# Patient Record
Sex: Female | Born: 1990
Health system: Southern US, Community
[De-identification: ages and names within clinical notes are randomized; demographics above are authoritative.]

## PROBLEM LIST (undated history)

## (undated) DIAGNOSIS — G43909 Migraine, unspecified, not intractable, without status migrainosus: Secondary | ICD-10-CM

## (undated) DIAGNOSIS — J42 Unspecified chronic bronchitis: Secondary | ICD-10-CM

## (undated) DIAGNOSIS — N200 Calculus of kidney: Secondary | ICD-10-CM

## (undated) HISTORY — PX: OTHER SURGICAL HISTORY: SHX169

## (undated) HISTORY — DX: Migraine, unspecified, not intractable, without status migrainosus: G43.909

## (undated) HISTORY — PX: ECTOPIC PREGNANCY SURGERY: SHX613

---

## 2018-07-11 ENCOUNTER — Other Ambulatory Visit: Payer: Self-pay

## 2018-07-11 ENCOUNTER — Emergency Department (HOSPITAL_COMMUNITY)
Admission: EM | Admit: 2018-07-11 | Discharge: 2018-07-11 | Disposition: A | Discharge status: Home / Self Care | Payer: 59 | Attending: Emergency Medicine | Admitting: Emergency Medicine

## 2018-07-11 ENCOUNTER — Emergency Department (HOSPITAL_COMMUNITY): Payer: 59

## 2018-07-11 ENCOUNTER — Encounter (HOSPITAL_COMMUNITY): Payer: Self-pay

## 2018-07-11 DIAGNOSIS — M549 Dorsalgia, unspecified: Secondary | ICD-10-CM | POA: Diagnosis present

## 2018-07-11 DIAGNOSIS — Z79899 Other long term (current) drug therapy: Secondary | ICD-10-CM | POA: Insufficient documentation

## 2018-07-11 DIAGNOSIS — J189 Pneumonia, unspecified organism: Secondary | ICD-10-CM

## 2018-07-11 DIAGNOSIS — J181 Lobar pneumonia, unspecified organism: Secondary | ICD-10-CM | POA: Insufficient documentation

## 2018-07-11 HISTORY — DX: Unspecified chronic bronchitis: J42

## 2018-07-11 HISTORY — DX: Calculus of kidney: N20.0

## 2018-07-11 LAB — CBC WITH DIFFERENTIAL/PLATELET
Basophils Absolute: 0 10*3/uL (ref 0.0–0.1)
Basophils Relative: 0 %
Eosinophils Absolute: 0 10*3/uL (ref 0.0–0.7)
Eosinophils Relative: 0 %
HCT: 41.1 % (ref 36.0–46.0)
Hemoglobin: 13.9 g/dL (ref 12.0–15.0)
Lymphocytes Relative: 24 %
Lymphs Abs: 1.4 10*3/uL (ref 0.7–4.0)
MCH: 29.3 pg (ref 26.0–34.0)
MCHC: 33.8 g/dL (ref 30.0–36.0)
MCV: 86.5 fL (ref 78.0–100.0)
Monocytes Absolute: 0.5 10*3/uL (ref 0.1–1.0)
Monocytes Relative: 8 %
Neutro Abs: 3.9 10*3/uL (ref 1.7–7.7)
Neutrophils Relative %: 68 %
Platelets: 194 10*3/uL (ref 150–400)
RBC: 4.75 MIL/uL (ref 3.87–5.11)
RDW: 13 % (ref 11.5–15.5)
WBC: 5.7 10*3/uL (ref 4.0–10.5)

## 2018-07-11 LAB — URINALYSIS, ROUTINE W REFLEX MICROSCOPIC
Bacteria, UA: NONE SEEN
Bilirubin Urine: NEGATIVE
Glucose, UA: NEGATIVE mg/dL
Ketones, ur: NEGATIVE mg/dL
Leukocytes, UA: NEGATIVE
Nitrite: NEGATIVE
Protein, ur: NEGATIVE mg/dL
Specific Gravity, Urine: 1.026 (ref 1.005–1.030)
pH: 6 (ref 5.0–8.0)

## 2018-07-11 LAB — COMPREHENSIVE METABOLIC PANEL
ALT: 22 U/L (ref 0–44)
AST: 18 U/L (ref 15–41)
Albumin: 3.8 g/dL (ref 3.5–5.0)
Alkaline Phosphatase: 66 U/L (ref 38–126)
Anion gap: 8 (ref 5–15)
BUN: 10 mg/dL (ref 6–20)
CO2: 22 mmol/L (ref 22–32)
Calcium: 8.6 mg/dL — ABNORMAL LOW (ref 8.9–10.3)
Chloride: 108 mmol/L (ref 98–111)
Creatinine, Ser: 0.59 mg/dL (ref 0.44–1.00)
GFR calc Af Amer: >60 mL/min (ref >60)
GFR calc non Af Amer: >60 mL/min (ref >60)
Glucose, Bld: 92 mg/dL (ref 70–99)
Potassium: 3.6 mmol/L (ref 3.5–5.1)
Sodium: 138 mmol/L (ref 135–145)
Total Bilirubin: 1.1 mg/dL (ref 0.3–1.2)
Total Protein: 7.1 g/dL (ref 6.5–8.1)

## 2018-07-11 LAB — PREGNANCY, URINE: Preg Test, Ur: NEGATIVE

## 2018-07-11 LAB — GROUP A STREP BY PCR: Group A Strep by PCR: NOT DETECTED

## 2018-07-11 MED ORDER — ALBUTEROL SULFATE HFA 108 (90 BASE) MCG/ACT IN AERS: 1.0000 | INHALATION_SPRAY | Freq: Four times a day (QID) | RESPIRATORY_TRACT | 0 refills | Status: DC | PRN

## 2018-07-11 MED ORDER — BENZONATATE 100 MG PO CAPS: 100.0000 mg | ORAL_CAPSULE | Freq: Three times a day (TID) | ORAL | 0 refills | Status: DC

## 2018-07-11 MED ORDER — DOXYCYCLINE HYCLATE 100 MG PO CAPS: 100.0000 mg | ORAL_CAPSULE | Freq: Two times a day (BID) | ORAL | 0 refills | Status: DC

## 2018-07-11 NOTE — ED Notes (Signed)
Patient verbalized understanding of discharge instructions, no questions. Patient ambulated out of ED with steady gait in no distress.  

## 2018-07-11 NOTE — ED Provider Notes (Signed)
[facility]-EMERGENCY DEPT Provider Note   CSN: [id number] Arrival date & time: 07/11/18  1231     History   Chief Complaint Chief Complaint  Patient presents with  . Back Pain  . Pelvic Pain    HPI Angelica Coleman is a 27 y.o. female with history of kidney stones, chronic bronchitis who presents with a 2-day history of fever, chills, back pain, nausea, headache, and shortness of breath.  Patient also reports some congestion in her chest and sore throat.  She reports left-sided back pain.  She reports last week she felt like she was getting UTI and drink a lot of water and has been feeling better.  She denies abdominal pain, diarrhea, dysuria at this time.  She felt nauseous once yesterday, but did not vomit.  She is tolerating oral fluids.  She has been taking ibuprofen at home.  She reports she has been around some people with a probable virus.  She also reports she moved a week and a half ago and was doing some heavy lifting.  She denies any abnormal vaginal bleeding or discharge.  She reports she did start her menstrual cycle today.  Patient denies any recent long trips, surgeries, known cancer, new leg pain or swelling, history of blood clots, exogenous estrogen use.  HPI  Past Medical History:  Diagnosis Date  . Chronic bronchitis (HCC)    as a child  . Kidney stones     There are no active problems to display for this patient.    OB History   None      Home Medications    Prior to Admission medications   Medication Sig Start Date End Date Taking? Authorizing Provider  ibuprofen (ADVIL,MOTRIN) 200 MG tablet Take 200-400 mg by mouth every 8 (eight) hours as needed for moderate pain.   Yes [provider]  albuterol (PROVENTIL HFA;VENTOLIN HFA) 108 (90 Base) MCG/ACT inhaler Inhale 1-2 puffs into the lungs every 6 (six) hours as needed for wheezing or shortness of breath. 07/11/18   Juliani Laduke, Waylan Boga, PA-C  benzonatate (TESSALON) 100 MG capsule  Take 1 capsule (100 mg total) by mouth every 8 (eight) hours. 07/11/18   Mayreli Alden, Waylan Boga, PA-C  doxycycline (VIBRAMYCIN) 100 MG capsule Take 1 capsule (100 mg total) by mouth 2 (two) times daily. 07/11/18   Emi Holes, PA-C    Family History No family history on file.  Social History Social History   Tobacco Use  . Smoking status: Not on file  Substance Use Topics  . Alcohol use: Not on file  . Drug use: Not on file     Allergies   Percocet [oxycodone-acetaminophen] and Penicillins   Review of Systems Review of Systems  Constitutional: Positive for chills, fatigue and fever.  HENT: Positive for sore throat. Negative for facial swelling.   Respiratory: Positive for cough and shortness of breath.   Cardiovascular: Negative for chest pain.  Gastrointestinal: Positive for nausea. Negative for abdominal pain and vomiting.  Genitourinary: Negative for dysuria.  Musculoskeletal: Positive for back pain.  Skin: Negative for rash and wound.  Neurological: Positive for weakness and headaches.  Psychiatric/Behavioral: The patient is not nervous/anxious.      Physical Exam Updated Vital Signs BP 113/84   Pulse 92   Temp 99.1 F (37.3 C) (Oral)   Resp 17   Ht 5\' 2"  (1.575 m)   Wt 86.2 kg (190 lb)   LMP 07/10/2018   SpO2 99%   BMI  34.75 kg/m   Physical Exam  Constitutional: She appears well-developed and well-nourished. No distress.  HENT:  Head: Normocephalic and atraumatic.  Right Ear: Tympanic membrane normal.  Left Ear: Tympanic membrane normal.  Mouth/Throat: Mucous membranes are normal. Posterior oropharyngeal edema present. No oropharyngeal exudate, posterior oropharyngeal erythema or tonsillar abscesses. Tonsils are 2+ on the right. Tonsils are 2+ on the left. No tonsillar exudate.  Eyes: Pupils are equal, round, and reactive to light. Conjunctivae are normal. Right eye exhibits no discharge. Left eye exhibits no discharge. No scleral icterus.  Neck: Normal  range of motion. Neck supple. No thyromegaly present.  Cardiovascular: Normal rate, regular rhythm, normal heart sounds and intact distal pulses. Exam reveals no gallop and no friction rub.  No murmur heard. Pulmonary/Chest: Effort normal and breath sounds normal. No stridor. No respiratory distress. She has no wheezes. She has no rales.  Abdominal: Soft. Bowel sounds are normal. She exhibits no distension. There is no tenderness. There is CVA tenderness (L). There is no rebound and no guarding.  Musculoskeletal: She exhibits no edema.       Back:  Lymphadenopathy:    She has no cervical adenopathy.  Neurological: She is alert. Coordination normal.  Skin: Skin is warm and dry. No rash noted. She is not diaphoretic. No pallor.  Psychiatric: She has a normal mood and affect.  Nursing note and vitals reviewed.    ED Treatments / Results  Labs (all labs ordered are listed, but only abnormal results are displayed) Labs Reviewed  URINALYSIS, ROUTINE W REFLEX MICROSCOPIC - Abnormal; Notable for the following components:      Result Value   Hgb urine dipstick MODERATE (*)    All other components within normal limits  COMPREHENSIVE METABOLIC PANEL - Abnormal; Notable for the following components:   Calcium 8.6 (*)    All other components within normal limits  GROUP A STREP BY PCR  URINE CULTURE  PREGNANCY, URINE  CBC WITH DIFFERENTIAL/PLATELET    EKG EKG Interpretation  Date/Time:  Wednesday July 11 2018 14:00:17 EDT Ventricular Rate:  88 PR Interval:    QRS Duration: 97 QT Interval:  338 QTC Calculation: 409 R Axis:   16 Text Interpretation:  Sinus rhythm RSR' in V1 or V2, right VCD or RVH Confirmed by Lorre Nick ([id number]) on 07/11/2018 3:51:56 PM   Radiology Dg Chest 2 View  Result Date: 07/11/2018 CLINICAL DATA:  Cough and fever for 4 days.  Shortness breath. EXAM: CHEST - 2 VIEW COMPARISON:  None. FINDINGS: The heart size and mediastinal contours are within normal  limits. Streaky opacity is seen in the posterior left lower lobe, suspicious for pneumonia. Right lung is clear. No evidence of pleural effusion IMPRESSION: Mild posterior left lower lobe infiltrate, suspicious for pneumonia. Electronically Signed   By: Myles Rosenthal M.D.   On: 07/11/2018 13:55    Procedures Procedures (including critical care time)  Medications Ordered in ED Medications - No data to display   Initial Impression / Assessment and Plan / ED Course  I have reviewed the triage vital signs and the nursing notes.  Pertinent labs & imaging results that were available during my care of the patient were reviewed by me and considered in my medical decision making (see chart for details).     Patient presenting with fever, chills, cough,  left-sided back pain.  Chest x-ray is concerning for left lower lobe pneumonia.  Labs unremarkable.  UA is negative for infection, patient on her menstrual cycle  which explains hematuria.  Very low suspicion for kidney stone.  Will treat with doxycycline.  Patient will also be discharged home with albuterol, Tessalon. Patient advised to follow-up and establish care with a primary care provider for recheck and x-ray.  Patient given resources to find a PCP, as she just moved here.  Return precautions discussed.  Patient understands and agrees with plan.  Patient vitals stable throughout ED course and discharged in satisfactory condition.  Final Clinical Impressions(s) / ED Diagnoses   Final diagnoses:  Community acquired pneumonia of left lower lobe of lung [facility](HCC)    ED Discharge Orders        Ordered    doxycycline (VIBRAMYCIN) 100 MG capsule  2 times daily     07/11/18 1600    benzonatate (TESSALON) 100 MG capsule  Every 8 hours     07/11/18 1600    albuterol (PROVENTIL HFA;VENTOLIN HFA) 108 (90 Base) MCG/ACT inhaler  Every 6 hours PRN     07/11/18 1601       Daysy Santini, Waylan Bogalexandra M, PA-C 07/11/18 1626    Lorre NickAllen, Anthony, MD 07/12/18 1258

## 2018-07-11 NOTE — ED Triage Notes (Signed)
Pt c/o chills and sweats x 2 days. Pt reports that she has severe back pain and legs,  and pelvis pain. ( reports moving recently) Pt also  reports that " my bronchial tubes are cracking".  Pt has multiple complaints HA, Nausea yesterday. Pt denies diarrhea, and abdominal pain. Denies pain or burning with urination

## 2018-07-11 NOTE — ED Notes (Signed)
Attempted blood drawn x 2 unsuccessful

## 2018-07-11 NOTE — ED Notes (Signed)
Bed: WA14 Expected date:  Expected time:  Means of arrival:  Comments: Hold for triage 7 

## 2018-07-11 NOTE — Discharge Instructions (Addendum)
Take doxycycline as prescribed until completed.  Take Tessalon every 8 hours as needed for cough.  Use albuterol inhaler every 6 hours as needed for shortness of breath or wheezing.  Alternate ibuprofen and Tylenol, as needed for your fevers.  Please establish care with a primary care provider by calling the number circled on your discharge paperwork.  It is recommended in 4 to 6 weeks that you have a repeat chest x-ray to make sure this is cleared.  You can tell your doctor about this when you see them.  Please return the emergency department if you develop any new or worsening symptoms.

## 2018-07-12 ENCOUNTER — Encounter (HOSPITAL_COMMUNITY): Payer: Self-pay | Admitting: Emergency Medicine

## 2018-07-12 ENCOUNTER — Emergency Department (HOSPITAL_COMMUNITY)
Admission: EM | Admit: 2018-07-12 | Discharge: 2018-07-12 | Disposition: A | Discharge status: Home / Self Care | Payer: 59 | Attending: Emergency Medicine | Admitting: Emergency Medicine

## 2018-07-12 DIAGNOSIS — J189 Pneumonia, unspecified organism: Secondary | ICD-10-CM

## 2018-07-12 DIAGNOSIS — R110 Nausea: Secondary | ICD-10-CM | POA: Diagnosis not present

## 2018-07-12 DIAGNOSIS — J181 Lobar pneumonia, unspecified organism: Secondary | ICD-10-CM | POA: Diagnosis not present

## 2018-07-12 DIAGNOSIS — R509 Fever, unspecified: Secondary | ICD-10-CM | POA: Diagnosis present

## 2018-07-12 LAB — URINALYSIS, ROUTINE W REFLEX MICROSCOPIC
Bilirubin Urine: NEGATIVE
GLUCOSE, UA: NEGATIVE mg/dL
KETONES UR: NEGATIVE mg/dL
Nitrite: NEGATIVE
PROTEIN: NEGATIVE mg/dL
Specific Gravity, Urine: 1.006 (ref 1.005–1.030)
pH: 6 (ref 5.0–8.0)

## 2018-07-12 LAB — URINE CULTURE: Culture: <10000 — AB

## 2018-07-12 LAB — PREGNANCY, URINE: PREG TEST UR: NEGATIVE

## 2018-07-12 MED ORDER — SODIUM CHLORIDE 0.9 % IV BOLUS
1000.0000 mL | Freq: Once | INTRAVENOUS | Status: AC
  Administered 2018-07-12: 1000 mL via INTRAVENOUS

## 2018-07-12 MED ORDER — FAMOTIDINE IN NACL 20-0.9 MG/50ML-% IV SOLN
20.0000 mg | Freq: Once | INTRAVENOUS | Status: AC
  Administered 2018-07-12: 20 mg via INTRAVENOUS
  Filled 2018-07-12: qty 50

## 2018-07-12 MED ORDER — ONDANSETRON 8 MG PO TBDP: 8.0000 mg | ORAL_TABLET | Freq: Three times a day (TID) | ORAL | 0 refills | Status: DC | PRN

## 2018-07-12 MED ORDER — ONDANSETRON HCL 4 MG/2ML IJ SOLN
4.0000 mg | Freq: Once | INTRAMUSCULAR | Status: AC
Start: 2018-07-12 — End: 2018-07-12
  Administered 2018-07-12: 4 mg via INTRAVENOUS
  Filled 2018-07-12: qty 2

## 2018-07-12 MED ORDER — ONDANSETRON HCL 4 MG/2ML IJ SOLN
4.0000 mg | Freq: Once | INTRAMUSCULAR | Status: AC
  Administered 2018-07-12: 4 mg via INTRAVENOUS
  Filled 2018-07-12: qty 2

## 2018-07-12 MED ORDER — ACETAMINOPHEN 500 MG PO TABS
1000.0000 mg | ORAL_TABLET | Freq: Once | ORAL | Status: AC
  Administered 2018-07-12: 1000 mg via ORAL
  Filled 2018-07-12: qty 2

## 2018-07-12 NOTE — ED Triage Notes (Signed)
Pt came to ED 7/17 was told she had pneumonia. Woke up with a fever. Felt so hot she was going to pass out along with nausea. Took motrin and tylenol alternating since 7/17.

## 2018-07-12 NOTE — ED Notes (Signed)
MADE FIRST REQUEST  FOR A URINE SAMPLE PATIENT UNABLE TO PROVIDE ONE AT THIS TIME.

## 2018-07-12 NOTE — ED Provider Notes (Signed)
[facility]-EMERGENCY DEPT Provider Note   CSN: [id number] Arrival date & time: 07/12/18  0758     History   Chief Complaint Chief Complaint  Patient presents with  . Nausea  . Fever    HPI Angelica Coleman is a 27 y.o. female.  Patient seen yesterday and diagnosed with left lower lobe pneumonia.  She was started on oral antibiotics.  She returns today with persistent nausea, intermittent fever, chills, sweats.  No productive sputum.  She is normally healthy.  She does not appear dehydrated.  Severity of symptoms is moderate.  She has taken Tylenol and ibuprofen for fever.     Past Medical History:  Diagnosis Date  . Chronic bronchitis (HCC)    as a child  . Kidney stones     There are no active problems to display for this patient.   History reviewed. No pertinent surgical history.   OB History   None      Home Medications    Prior to Admission medications   Medication Sig Start Date End Date Taking? Authorizing Provider  acetaminophen (TYLENOL) 500 MG tablet Take 1,000 mg by mouth every 6 (six) hours as needed for mild pain.   Yes [provider]  albuterol (PROVENTIL HFA;VENTOLIN HFA) 108 (90 Base) MCG/ACT inhaler Inhale 1-2 puffs into the lungs every 6 (six) hours as needed for wheezing or shortness of breath. 07/11/18  Yes Law, Waylan Boga, PA-C  benzonatate (TESSALON) 100 MG capsule Take 1 capsule (100 mg total) by mouth every 8 (eight) hours. 07/11/18  Yes Law, Waylan Boga, PA-C  doxycycline (VIBRAMYCIN) 100 MG capsule Take 1 capsule (100 mg total) by mouth 2 (two) times daily. 07/11/18  Yes Law, Waylan Boga, PA-C  ibuprofen (ADVIL,MOTRIN) 200 MG tablet Take 200-400 mg by mouth every 8 (eight) hours as needed for moderate pain.   Yes [provider]  ondansetron (ZOFRAN ODT) 8 MG disintegrating tablet Take 1 tablet (8 mg total) by mouth every 8 (eight) hours as needed for nausea or vomiting. 07/12/18   Donnetta Hutching, MD     Family History No family history on file.  Social History Social History   Tobacco Use  . Smoking status: Not on file  Substance Use Topics  . Alcohol use: Not on file  . Drug use: Not on file     Allergies   Percocet [oxycodone-acetaminophen] and Penicillins   Review of Systems Review of Systems  All other systems reviewed and are negative.    Physical Exam Updated Vital Signs BP (!) 156/109 (BP Location: Left Arm)   Pulse 84   Temp 98.7 F (37.1 C) (Oral)   Resp 16   Ht 5\' 2"  (1.575 m)   Wt 86.2 kg (190 lb)   LMP 07/10/2018   SpO2 100%   BMI 34.75 kg/m   Physical Exam  Constitutional: She is oriented to person, place, and time. She appears well-developed and well-nourished.  HENT:  Head: Normocephalic and atraumatic.  Eyes: Conjunctivae are normal.  Neck: Neck supple.  Cardiovascular: Normal rate and regular rhythm.  Pulmonary/Chest: Effort normal and breath sounds normal.  Abdominal: Soft. Bowel sounds are normal.  Musculoskeletal: Normal range of motion.  Neurological: She is alert and oriented to person, place, and time.  Skin: Skin is warm and dry.  Psychiatric: She has a normal mood and affect. Her behavior is normal.  Nursing note and vitals reviewed.    ED Treatments / Results  Labs (all labs ordered  are listed, but only abnormal results are displayed) Labs Reviewed  URINALYSIS, ROUTINE W REFLEX MICROSCOPIC - Abnormal; Notable for the following components:      Result Value   Hgb urine dipstick MODERATE (*)    Leukocytes, UA TRACE (*)    Bacteria, UA RARE (*)    All other components within normal limits  PREGNANCY, URINE    EKG None  Radiology Dg Chest 2 View  Result Date: 07/11/2018 CLINICAL DATA:  Cough and fever for 4 days.  Shortness breath. EXAM: CHEST - 2 VIEW COMPARISON:  None. FINDINGS: The heart size and mediastinal contours are within normal limits. Streaky opacity is seen in the posterior left lower lobe, suspicious  for pneumonia. Right lung is clear. No evidence of pleural effusion IMPRESSION: Mild posterior left lower lobe infiltrate, suspicious for pneumonia. Electronically Signed   By: Myles RosenthalJohn  Stahl M.D.   On: 07/11/2018 13:55    Procedures Procedures (including critical care time)  Medications Ordered in ED Medications  sodium chloride 0.9 % bolus 1,000 mL (0 mLs Intravenous Stopped 07/12/18 1156)  ondansetron (ZOFRAN) injection 4 mg (4 mg Intravenous Given 07/12/18 1009)  sodium chloride 0.9 % bolus 1,000 mL (0 mLs Intravenous Stopped 07/12/18 1344)  acetaminophen (TYLENOL) tablet 1,000 mg (1,000 mg Oral Given 07/12/18 0956)  ondansetron (ZOFRAN) injection 4 mg (4 mg Intravenous Given 07/12/18 1300)  famotidine (PEPCID) IVPB 20 mg premix (20 mg Intravenous New Bag/Given 07/12/18 1300)     Initial Impression / Assessment and Plan / ED Course  I have reviewed the triage vital signs and the nursing notes.  Pertinent labs & imaging results that were available during my care of the patient were reviewed by me and considered in my medical decision making (see chart for details).     Patient presents with a known diagnosis of left lower lobe pneumonia.  She complains of fever, sweats, chills, nausea.  She feels much better after IV hydration, IV Zofran, IV Pepcid.  She is stable for discharge.  Discussed diagnosis with the patient.  Discharge medications Zofran 8 mg.  Final Clinical Impressions(s) / ED Diagnoses   Final diagnoses:  Community acquired pneumonia of left lower lobe of lung (HCC)  Nausea    ED Discharge Orders        Ordered    ondansetron (ZOFRAN ODT) 8 MG disintegrating tablet  Every 8 hours PRN     07/12/18 1435       Donnetta Hutchingook, Kaiesha Tonner, MD 07/12/18 1440

## 2018-07-12 NOTE — Discharge Instructions (Addendum)
I reviewed your x-ray from yesterday.  It does appear that you have a left-sided pneumonia.  Increase fluids.  Tylenol and/or ibuprofen for fever.  Continue your antibiotic.  Prescription for Zofran 8 mg (nausea).

## 2018-09-19 NOTE — Progress Notes (Signed)
NEUROLOGY CONSULTATION NOTE  Angelica Coleman MRN: [id number] DOB: 08-22-1991  Referring provider: Lindaann Pascal, MD (Urgent Care referral) Primary care provider: No PCP  Reason for consult:  Visual disturbance  HISTORY OF PRESENT ILLNESS: Angelica Coleman is a 27 year old left-handed female with mitral regurgitation, palpitations and history of recurrent kidney stones who presents for visual disturbance.  3 years ago, while pregnant with her son, she developed numbness and tingling of her hands and nose associated with vision loss in both eyes described as only seeing the outline of whatever she looks at with blurred vision.  Numbness lasts 20 minutes but visual disturbance lasts longer.  It is followed by severe pounding headache on crown lasting into the next day.  It is associated with nausea, photophobia but no unilateral weakness.  It is aggravated by looking at computer screens or fluorescent lights.  It is relieved by resting in dark room.  On Monday, she got home from work and had another episode.  She went to bed and when she woke up, she was fine.  Later the next day, symptoms returned.  She had associated fatigue.  Symptoms lasted about 20 minutes and followed by the headache.  She went to Urgent Care and was referred to neurology.  She took ibuprofen 200mg  which was ineffective.  600mg  helped to at least go to sleep.  She did not tolerate Excedrin.  She takes Toprol XL 25mg  for palpitations.  Palpitations have been stronger lately and usually occur during her menstrual cycle.  She had a strong menstrual cycle that ended on Sunday.  She reports increased emotional stressors due to recently moving here from [place], new job, and kids starting school.  Caffeine:  No Exercise:  Trying to exercise Depression:  No; Anxiety: Some Sleep hygiene:  Good. Family history:  Mother had stroke in her 52s.  She recently had a migraine with stroke-like symptoms but no prior history of  migraines.  PAST MEDICAL HISTORY: Past Medical History:  Diagnosis Date  . Chronic bronchitis (HCC)    as a child  . Kidney stones   Mitral regurgitation  PAST SURGICAL HISTORY: No past surgical history on file.  MEDICATIONS: Current Outpatient Medications on File Prior to Visit  Medication Sig Dispense Refill  . acetaminophen (TYLENOL) 500 MG tablet Take 1,000 mg by mouth every 6 (six) hours as needed for mild pain.    Marland Kitchen albuterol (PROVENTIL HFA;VENTOLIN HFA) 108 (90 Base) MCG/ACT inhaler Inhale 1-2 puffs into the lungs every 6 (six) hours as needed for wheezing or shortness of breath. 1 Inhaler 0  . benzonatate (TESSALON) 100 MG capsule Take 1 capsule (100 mg total) by mouth every 8 (eight) hours. 21 capsule 0  . doxycycline (VIBRAMYCIN) 100 MG capsule Take 1 capsule (100 mg total) by mouth 2 (two) times daily. 14 capsule 0  . ibuprofen (ADVIL,MOTRIN) 200 MG tablet Take 200-400 mg by mouth every 8 (eight) hours as needed for moderate pain.    Marland Kitchen ondansetron (ZOFRAN ODT) 8 MG disintegrating tablet Take 1 tablet (8 mg total) by mouth every 8 (eight) hours as needed for nausea or vomiting. 15 tablet 0   No current facility-administered medications on file prior to visit.     ALLERGIES: Allergies  Allergen Reactions  . Percocet [Oxycodone-Acetaminophen] Other (See Comments)    Pt reports fainting  . Penicillins Hives    Has patient had a PCN reaction causing immediate rash, facial/tongue/throat swelling, SOB or lightheadedness with hypotension: Yes Has patient had  a PCN reaction causing severe rash involving mucus membranes or skin necrosis: No Has patient had a PCN reaction that required hospitalization: No Has patient had a PCN reaction occurring within the last 10 years: Yes If all of the above answers are "NO", then may proceed with Cephalosporin use.     FAMILY HISTORY: Mom:  Stroke at 55  SOCIAL HISTORY: Social History   Socioeconomic History  . Marital status:  Married    Spouse name: Not on file  . Number of children: Not on file  . Years of education: Not on file  . Highest education level: Not on file  Occupational History  . Not on file  Social Needs  . Financial resource strain: Not on file  . Food insecurity:    Worry: Not on file    Inability: Not on file  . Transportation needs:    Medical: Not on file    Non-medical: Not on file  Tobacco Use  . Smoking status: Not on file  Substance and Sexual Activity  . Alcohol use: Not on file  . Drug use: Not on file  . Sexual activity: Not on file  Lifestyle  . Physical activity:    Days per week: Not on file    Minutes per session: Not on file  . Stress: Not on file  Relationships  . Social connections:    Talks on phone: Not on file    Gets together: Not on file    Attends religious service: Not on file    Active member of club or organization: Not on file    Attends meetings of clubs or organizations: Not on file    Relationship status: Not on file  . Intimate partner violence:    Fear of current or ex partner: Not on file    Emotionally abused: Not on file    Physically abused: Not on file    Forced sexual activity: Not on file  Other Topics Concern  . Not on file  Social History Narrative  . Not on file    REVIEW OF SYSTEMS: Constitutional: No fevers, chills, or sweats, no generalized fatigue, change in appetite Eyes: No visual changes, double vision, eye pain Ear, nose and throat: No hearing loss, ear pain, nasal congestion, sore throat Cardiovascular: palpitations Respiratory:  No shortness of breath at rest or with exertion, wheezes GastrointestinaI: No nausea, vomiting, diarrhea, abdominal pain, fecal incontinence Genitourinary:  No dysuria, urinary retention or frequency Musculoskeletal:  No neck pain, back pain Integumentary: No rash, pruritus, skin lesions Neurological: as above Psychiatric: No depression, insomnia, anxiety Endocrine: No palpitations,  fatigue, diaphoresis, mood swings, change in appetite, change in weight, increased thirst Hematologic/Lymphatic:  No purpura, petechiae. Allergic/Immunologic: no itchy/runny eyes, nasal congestion, recent allergic reactions, rashes  PHYSICAL EXAM: Blood pressure 100/78, pulse 87, height 5\' 2"  (1.575 m), weight 177 lb (80.3 kg), SpO2 98 %. General: No acute distress.  Patient appears well-groomed.  Head:  Normocephalic/atraumatic Eyes:  fundi examined but not visualized Neck: supple, no paraspinal tenderness, full range of motion Back: No paraspinal tenderness Heart: regular rate and rhythm Lungs: Clear to auscultation bilaterally. Vascular: No carotid bruits. Neurological Exam: Mental status: alert and oriented to person, place, and time, recent and remote memory intact, fund of knowledge intact, attention and concentration intact, speech fluent and not dysarthric, language intact. Cranial nerves: CN I: not tested CN II: pupils equal, round and reactive to light, visual fields intact CN III, IV, VI:  full range of  motion, no nystagmus, no ptosis CN V: facial sensation intact CN VII: upper and lower face symmetric CN VIII: hearing intact CN IX, X: gag intact, uvula midline CN XI: sternocleidomastoid and trapezius muscles intact CN XII: tongue midline Bulk & Tone: normal, no fasciculations. Motor:  5/5 throughout  Sensation:  temperature and vibration sensation intact. Deep Tendon Reflexes:  2+ throughout, toes downgoing.  Finger to nose testing:  Without dysmetria.  Heel to shin:  Without dysmetria.  Gait:  Normal station and stride.  Able to turn and tandem walk. Romberg negative.  IMPRESSION: Migraine with aura, without status migrainosus, not intractable  PLAN: 1.  For preventative management, nortriptyline 10mg  at bedtime 2.  For abortive therapy, naproxen 550mg .  Given her history of palpitations/irregular heart rate, advised to establish care with cardiologist and to get  clearance for triptans. 3.  Limit use of pain relievers to no more than 2 days out of week to prevent risk of rebound or medication-overuse headache. 4.  Keep headache diary 5.  Exercise, hydration, caffeine cessation, sleep hygiene, monitor for and avoid triggers 6.  Consider:  magnesium citrate 400mg  daily, riboflavin 400mg  daily, and coenzyme Q10 100mg  three times daily 7.  Follow up in 4 months.  45 minutes spent face to face with patient, over 50% spent discussing diagnosis and plan.  Shon Millet, DO

## 2018-09-20 ENCOUNTER — Encounter: Payer: Self-pay | Admitting: Neurology

## 2018-09-20 ENCOUNTER — Ambulatory Visit (INDEPENDENT_AMBULATORY_CARE_PROVIDER_SITE_OTHER): Payer: 59 | Admitting: Neurology

## 2018-09-20 VITALS — BP 100/78 | HR 87 | Ht 62.0 in | Wt 177.0 lb

## 2018-09-20 DIAGNOSIS — G43109 Migraine with aura, not intractable, without status migrainosus: Secondary | ICD-10-CM

## 2018-09-20 MED ORDER — NORTRIPTYLINE HCL 10 MG PO CAPS: 10.0000 mg | ORAL_CAPSULE | Freq: Every day | ORAL | 3 refills | Status: DC

## 2018-09-20 MED ORDER — NAPROXEN SODIUM 550 MG PO TABS: 550.0000 mg | ORAL_TABLET | Freq: Two times a day (BID) | ORAL | 3 refills | Status: DC | PRN

## 2018-09-20 NOTE — Patient Instructions (Addendum)
Migraine Recommendations: 1.  Start nortriptyline 10mg  at bedtime.  Contact me in 4 weeks with update and we can adjust dose if needed. 2.  Take naproxen 550mg  at earliest onset of migraine.   3.  Limit use of pain relievers to no more than 2 days out of the week.  These medications include acetaminophen, ibuprofen, triptans and narcotics.  This will help reduce risk of rebound headaches. 4.  Be aware of common food triggers such as processed sweets, processed foods with nitrites (such as deli meat, hot dogs, sausages), foods with MSG, alcohol (such as wine), chocolate, certain cheeses, certain fruits (dried fruits, bananas, some citrus fruit), vinegar, diet soda. 4.  Avoid caffeine 5.  Routine exercise 6.  Proper sleep hygiene 7.  Stay adequately hydrated with water 8.  Keep a headache diary. 9.  Maintain proper stress management. 10.  Do not skip meals. 11.  Consider supplements:  Magnesium citrate 400mg  to 600mg  daily, riboflavin 400mg , Coenzyme Q 10 100mg  three times daily 12.  Follow up in 4 months.  Please establish care with a cardiologist and see if it is okay for you to take Imitrex or other triptan medications (for migraines)

## 2018-12-08 ENCOUNTER — Other Ambulatory Visit: Payer: Self-pay | Admitting: Neurology

## 2018-12-31 ENCOUNTER — Telehealth: Payer: Self-pay

## 2018-12-31 MED ORDER — SUMATRIPTAN SUCCINATE 100 MG PO TABS: 100.0000 mg | ORAL_TABLET | ORAL | 0 refills | Status: DC

## 2018-12-31 NOTE — Telephone Encounter (Signed)
Reviewed letter.  No structural heart disease noted.  Can come today for toradol injection and see if that helps.  Can also call imitrex, 100 mg, 1 at onset of headache, may repeat in 2 hours.  Max 2 per day.  Max 2- 3 days per week.  #9.  Refills 0

## 2018-12-31 NOTE — Telephone Encounter (Signed)
Pt called c/o severe headache. Has taken Naproxen 550 mg as directed without relief. She has been taking nortriptyline 10 mg and was prescribed propranolol 60 mg by her cardiologist additionally. This is the first severe headache she has had since adding the propranolol.  Pt states she is unable to even lift her head from the pillow. She denies any N/V. Dr. Everlena CooperJaffe deferred using a triptan to cardiology. We received a letter (scanned 10/29/18) indicating no further cardiac testing needed.   What other recommendations for abortative?

## 2018-12-31 NOTE — Addendum Note (Signed)
Addended by: Dorthy Cooler on: 12/31/2018 09:13 AM   Modules accepted: Orders

## 2018-12-31 NOTE — Telephone Encounter (Signed)
Called and advised Pt 

## 2019-01-01 ENCOUNTER — Telehealth: Payer: Self-pay

## 2019-01-01 NOTE — Telephone Encounter (Signed)
Pt called stating her headache has almost completely resolved, though her right hand has a strange "drawing" feeling. I questioned her if it was similar to the numbness and tingling she has experienced in her hands in the past with her headaches. She said it was, though it has lasted longer, with no numbness and tingling now. I advised Pt not all headaches are the same and symptoms can  present differently. Pt agreed that the headache she had yesterday was intense, very painful and she had to take 2 doses of sumatriptan.  The Pt is at work and will call back if symptom does not resolve or worsens

## 2019-01-21 NOTE — Progress Notes (Signed)
NEUROLOGY FOLLOW UP OFFICE NOTE  Angelica Coleman [id number]  HISTORY OF PRESENT ILLNESS: Angelica Coleman is a 28 year old left-handed woman with mitral regurgitation, palpitations and history of recurrent kidney stones who follows up for migraine with aura.  UPDATE: 3 weeks ago on Monday she woke up with excruciating headache.  Sumatriptan effective after 2nd dose.  She developed numbness in the right hand.  The next day, she woke up and her right hand and forearm was still numb.  She went to work and she had trouble using the keyboard on her computer due to difficulty with her fingers in the right hand.  That night, it started hurting.  Then on Wednesday, she was well.  On Thursday, she developed sudden right sided numbness face, arm, torso and leg, which lasted about 24 hours.    She was having trouble with memory such as names and events.  She had an MRI of brain with and without contrast performed on 01/08/19, which demonstrated 3 mm enhancing nodular focus in the superficial left parietal region with small amount of adjacent parenchymal T2/FLAIR hyperintensity.  Differential mentioned includes "questionable small subacute infarct with parenchymal enhancement versus possible mycotic aneurysm with adjacent inflammatory changes versus other."  Imaging not available to personally review.  She was started on ASA 325mg  daily by her PCP for possible stroke.    She was feeling fatigued for the next couple of weeks.  No fevers.  She started feeling better last week.  Every now and then she notes random bursts of scalp neuralgia (burning/needles) for 5 minutes.    PCP ordered prednisone/promethazine/benadryl cocktail for migraine abortive therapy.  Current NSAID:  Naproxen 550mg  Current analgesic:  None Current triptan:  Sumatriptan Current antihypertensive:  Propranolol ER 60mg  (started by cardiologist soon after last visit) Current antidepressant:  Nortriptyline 10mg . Current triptan:  Sumatriptan  100mg   Caffeine:  No Exercise:  Trying to exercise Depression:  No; Anxiety: Some Sleep hygiene:  Good.  HISTORY: In 2016, while pregnant with her son, she developed numbness and tingling of her hands and nose associated with vision loss in both eyes described as only seeing the outline of whatever she looks at with blurred vision.  Numbness lasts 20 minutes but visual disturbance lasts longer.  It is followed by severe pounding headache on crown lasting into the next day.  It is associated with nausea, photophobia but no unilateral weakness.  It is aggravated by looking at computer screens or fluorescent lights.  It is relieved by resting in dark room.  In September 2019, she got home from work and had another episode.  She went to bed and when she woke up, she was fine.  Later the next day, symptoms returned.  She had associated fatigue.  Symptoms lasted about 20 minutes and followed by the headache.  She went to Urgent Care and was referred to neurology.  She took ibuprofen 200mg  which was ineffective.  600mg  helped to at least go to sleep.  She did not tolerate Excedrin.  She previously took Toprol XL 25mg  for palpitations.  Palpitations have been stronger lately and usually occur during her menstrual cycle.   Family history:  Mother had stroke in her 57s.  She recently had a migraine with stroke-like symptoms but no prior history of migraines.  Past antidepressant:  Nortriptyline (nightmares)  PAST MEDICAL HISTORY: Past Medical History:  Diagnosis Date  . Chronic bronchitis (HCC)    as a child  . Kidney stones  MEDICATIONS: Current Outpatient Medications on File Prior to Visit  Medication Sig Dispense Refill  . acetaminophen (TYLENOL) 500 MG tablet Take 1,000 mg by mouth every 6 (six) hours as needed for mild pain.    Marland Kitchen. albuterol (PROVENTIL HFA;VENTOLIN HFA) 108 (90 Base) MCG/ACT inhaler Inhale 1-2 puffs into the lungs every 6 (six) hours as needed for wheezing or shortness  of breath. 1 Inhaler 0  . ibuprofen (ADVIL,MOTRIN) 200 MG tablet Take 200-400 mg by mouth every 8 (eight) hours as needed for moderate pain.    . metoprolol succinate (TOPROL-XL) 25 MG 24 hr tablet Take 25 mg by mouth daily as needed.    . naproxen sodium (ANAPROX DS) 550 MG tablet Take 1 tablet (550 mg total) by mouth 2 (two) times daily as needed. 20 tablet 3  . nortriptyline (PAMELOR) 10 MG capsule TAKE 1 CAPSULE (10 MG TOTAL) BY MOUTH AT BEDTIME. 90 capsule 1  . ondansetron (ZOFRAN ODT) 8 MG disintegrating tablet Take 1 tablet (8 mg total) by mouth every 8 (eight) hours as needed for nausea or vomiting. 15 tablet 0  . SUMAtriptan (IMITREX) 100 MG tablet Take 1 tablet (100 mg total) by mouth as directed for 1 dose. May repeat in 2 hours if headache persists or recurs. Max 2/24 hrs 9 tablet 0   No current facility-administered medications on file prior to visit.     ALLERGIES: Allergies  Allergen Reactions  . Percocet [Oxycodone-Acetaminophen] Other (See Comments)    Pt reports fainting  . Penicillins Hives    Has patient had a PCN reaction causing immediate rash, facial/tongue/throat swelling, SOB or lightheadedness with hypotension: Yes Has patient had a PCN reaction causing severe rash involving mucus membranes or skin necrosis: No Has patient had a PCN reaction that required hospitalization: No Has patient had a PCN reaction occurring within the last 10 years: Yes If all of the above answers are "NO", then may proceed with Cephalosporin use.     FAMILY HISTORY: Family History  Problem Relation Age of Onset  . Hypertension Mother   . High Cholesterol Mother   . CVA Mother [id number]  . Hypertension Father   . High Cholesterol Father   . Diabetes Sister   . Hypertension Sister    SOCIAL HISTORY: Social History   Socioeconomic History  . Marital status: Married    Spouse name: Josh  . Number of children: 2  . Years of education: Not on file  . Highest education level: Some  college, no degree  Occupational History  . Occupation: Event organisermanager    Employer: [employer]  Social Needs  . Financial resource strain: Not on file  . Food insecurity:    Worry: Not on file    Inability: Not on file  . Transportation needs:    Medical: Not on file    Non-medical: Not on file  Tobacco Use  . Smoking status: Never Smoker  . Smokeless tobacco: Never Used  Substance and Sexual Activity  . Alcohol use: Not on file  . Drug use: Not on file  . Sexual activity: Not on file  Lifestyle  . Physical activity:    Days per week: Not on file    Minutes per session: Not on file  . Stress: Not on file  Relationships  . Social connections:    Talks on phone: Not on file    Gets together: Not on file    Attends religious service: Not on file    Active member of  club or organization: Not on file    Attends meetings of clubs or organizations: Not on file    Relationship status: Not on file  . Intimate partner violence:    Fear of current or ex partner: Not on file    Emotionally abused: Not on file    Physically abused: Not on file    Forced sexual activity: Not on file  Other Topics Concern  . Not on file  Social History Narrative   Patient is left-handed. She lives with her husband and 2 children. She avoids caffeine. She was exercising 5 x week until her son's school schedule changes recently.     REVIEW OF SYSTEMS: Constitutional: No fevers, chills, or sweats, no generalized fatigue, change in appetite Eyes: No visual changes, double vision, eye pain Ear, nose and throat: No hearing loss, ear pain, nasal congestion, sore throat Cardiovascular: No chest pain, palpitations Respiratory:  No shortness of breath at rest or with exertion, wheezes GastrointestinaI: No nausea, vomiting, diarrhea, abdominal pain, fecal incontinence Genitourinary:  No dysuria, urinary retention or frequency Musculoskeletal:  No neck pain, back pain Integumentary: No rash, pruritus, skin  lesions Neurological: as above Psychiatric: No depression, insomnia, anxiety Endocrine: No palpitations, fatigue, diaphoresis, mood swings, change in appetite, change in weight, increased thirst Hematologic/Lymphatic:  No purpura, petechiae. Allergic/Immunologic: no itchy/runny eyes, nasal congestion, recent allergic reactions, rashes  PHYSICAL EXAM: Blood pressure 106/78, pulse 92, height 5\' 2"  (1.575 m), weight 200 lb (90.7 kg), SpO2 97 %. General: No acute distress.  Patient appears well-groomed.   Head:  Normocephalic/atraumatic Eyes:  Fundi examined but not visualized Neck: supple, no paraspinal tenderness, full range of motion Heart:  Regular rate and rhythm Lungs:  Clear to auscultation bilaterally Back: No paraspinal tenderness Neurological Exam: alert and oriented to person, place, and time. Attention span and concentration intact, recent and remote memory intact, fund of knowledge intact.  Speech fluent and not dysarthric, language intact.  CN II-XII intact. Bulk and tone normal, muscle strength 5/5 throughout.  Sensation to light touch, temperature and vibration intact.  Deep tendon reflexes 2+ throughout, toes downgoing.  Finger to nose and heel to shin testing intact.  Gait normal, Romberg negative.  IMPRESSION: Brain MRI abnormality.  Unclear etiology.  .Location may correlate with right sided symptoms.  I think she does have migraine with aura as well.  PLAN: 1. We will first check CTA of head and neck to evaluate for intracranial and extracranial vascular abnormality to explain symptoms and MRI findings, such as mycotic aneurysm.  Pending results, will consider LP.  Repeat MRI of brain with and without contrast is warranted in 3 months. 2. I will have her decrease ASA to 81mg  daily for now until we can identify the lesion on MRI  3. Stop nortriptyline.  For migraine preventative management, she will continue propranolol ER 60mg  daily alone. 4.  Due to possibility of stroke,  she will stop sumatriptan.  For abortive therapy, she may use naproxen. 5.  Limit use of pain relievers to no more than 2 days out of week to prevent risk of rebound or medication-overuse headache. 6.  Keep headache diary 7.  Exercise, hydration, caffeine cessation, sleep hygiene, monitor for and avoid triggers 8.  Consider:  magnesium citrate 400mg  daily, riboflavin 400mg  daily, and coenzyme Q10 100mg  three times daily 9. Will follow up after testing completed.  Shon Millet, DO

## 2019-01-22 ENCOUNTER — Ambulatory Visit (INDEPENDENT_AMBULATORY_CARE_PROVIDER_SITE_OTHER): Payer: 59 | Admitting: Neurology

## 2019-01-22 ENCOUNTER — Encounter: Payer: Self-pay | Admitting: Neurology

## 2019-01-22 VITALS — BP 106/78 | HR 92 | Ht 62.0 in | Wt 200.0 lb

## 2019-01-22 DIAGNOSIS — R2990 Unspecified symptoms and signs involving the nervous system: Secondary | ICD-10-CM | POA: Diagnosis not present

## 2019-01-22 DIAGNOSIS — R9089 Other abnormal findings on diagnostic imaging of central nervous system: Secondary | ICD-10-CM

## 2019-01-22 DIAGNOSIS — G43109 Migraine with aura, not intractable, without status migrainosus: Secondary | ICD-10-CM | POA: Diagnosis not present

## 2019-01-22 NOTE — Patient Instructions (Addendum)
1.  We will check CTA of head and neck.  Further recommendations pending results (May need to perform spinal tap) 2.  Stop nortriptyline and sumatriptan.  May use naproxen but sparingly 3.  Decrease aspirin to 81mg  daily for now. 4.  Schedule follow up in 3 to 4 months but further recommendations will be made between that time.  We have sent a referral to [facility] for your CTA of the head and neck and they will call you directly to schedule your appt. They are located at [address] [facility]. If you need to contact them directly please call [phone].

## 2019-01-29 ENCOUNTER — Ambulatory Visit
Admission: RE | Admit: 2019-01-29 | Discharge: 2019-01-29 | Disposition: A | Discharge status: Home / Self Care | Payer: 59 | Source: Ambulatory Visit | Attending: Neurology | Admitting: Neurology

## 2019-01-29 DIAGNOSIS — R9089 Other abnormal findings on diagnostic imaging of central nervous system: Secondary | ICD-10-CM

## 2019-01-29 DIAGNOSIS — R2990 Unspecified symptoms and signs involving the nervous system: Secondary | ICD-10-CM

## 2019-01-29 MED ORDER — IOPAMIDOL (ISOVUE-370) INJECTION 76%
75.0000 mL | Freq: Once | INTRAVENOUS | Status: AC | PRN
  Administered 2019-01-29: 75 mL via INTRAVENOUS

## 2019-01-31 ENCOUNTER — Telehealth: Payer: Self-pay

## 2019-01-31 ENCOUNTER — Telehealth: Payer: Self-pay | Admitting: Neurology

## 2019-01-31 DIAGNOSIS — R2990 Unspecified symptoms and signs involving the nervous system: Secondary | ICD-10-CM

## 2019-01-31 DIAGNOSIS — R9089 Other abnormal findings on diagnostic imaging of central nervous system: Secondary | ICD-10-CM

## 2019-01-31 NOTE — Telephone Encounter (Signed)
Patient calling for CTA scan results. Please call her back at [phone]. Thanks!

## 2019-01-31 NOTE — Telephone Encounter (Signed)
See imaging note 

## 2019-01-31 NOTE — Telephone Encounter (Signed)
Called and LMOVM asking Pt to return my call 

## 2019-01-31 NOTE — Telephone Encounter (Signed)
-----   Message from Drema [place], DO sent at 01/30/2019 12:13 PM EST ----- The CT shows no vascular abnormality.  I would favor repeating the MRI of brain with and without contrast in 3 months to look for any changes.

## 2019-02-01 ENCOUNTER — Telehealth: Payer: Self-pay | Admitting: Neurology

## 2019-02-01 NOTE — Telephone Encounter (Signed)
Spoke with Pt, gave results. See MRI results note.

## 2019-02-01 NOTE — Addendum Note (Signed)
Addended by: Dorthy Cooler on: 02/01/2019 08:13 AM   Modules accepted: Orders

## 2019-02-01 NOTE — Telephone Encounter (Signed)
Spoke with Pt, advised her of results and of MRI recommendation in 3 months.  Pt was questioning discussion of LP as well. I advise her I will confirm with Dr. Everlena Cooper that is not warranted at this time and message her on My Chart. Pt is fine with that.

## 2019-02-01 NOTE — Telephone Encounter (Signed)
I am inclined to first repeat the MRI rather than going straight to an invasive procedure at this time.

## 2019-02-01 NOTE — Telephone Encounter (Signed)
Patient returning your call regarding results. Thanks

## 2019-02-11 ENCOUNTER — Emergency Department (HOSPITAL_COMMUNITY): Payer: 59

## 2019-02-11 ENCOUNTER — Encounter (HOSPITAL_COMMUNITY): Payer: Self-pay

## 2019-02-11 ENCOUNTER — Other Ambulatory Visit: Payer: Self-pay

## 2019-02-11 ENCOUNTER — Telehealth: Payer: Self-pay | Admitting: Neurology

## 2019-02-11 ENCOUNTER — Emergency Department (HOSPITAL_COMMUNITY)
Admission: EM | Admit: 2019-02-11 | Discharge: 2019-02-11 | Disposition: A | Discharge status: Home / Self Care | Payer: 59 | Attending: Emergency Medicine | Admitting: Emergency Medicine

## 2019-02-11 DIAGNOSIS — G43109 Migraine with aura, not intractable, without status migrainosus: Secondary | ICD-10-CM | POA: Insufficient documentation

## 2019-02-11 DIAGNOSIS — R200 Anesthesia of skin: Secondary | ICD-10-CM | POA: Insufficient documentation

## 2019-02-11 DIAGNOSIS — Z880 Allergy status to penicillin: Secondary | ICD-10-CM | POA: Diagnosis not present

## 2019-02-11 DIAGNOSIS — Z79899 Other long term (current) drug therapy: Secondary | ICD-10-CM | POA: Insufficient documentation

## 2019-02-11 DIAGNOSIS — R001 Bradycardia, unspecified: Secondary | ICD-10-CM | POA: Diagnosis not present

## 2019-02-11 DIAGNOSIS — R202 Paresthesia of skin: Secondary | ICD-10-CM | POA: Diagnosis present

## 2019-02-11 LAB — I-STAT BETA HCG BLOOD, ED (MC, WL, AP ONLY): I-stat hCG, quantitative: <5 m[IU]/mL (ref <5)

## 2019-02-11 LAB — DIFFERENTIAL
Abs Immature Granulocytes: 0.01 10*3/uL (ref 0.00–0.07)
BASOS ABS: 0 10*3/uL (ref 0.0–0.1)
Basophils Relative: 0 %
Eosinophils Absolute: 0.1 10*3/uL (ref 0.0–0.5)
Eosinophils Relative: 2 %
Immature Granulocytes: 0 %
Lymphocytes Relative: 35 %
Lymphs Abs: 2.3 10*3/uL (ref 0.7–4.0)
Monocytes Absolute: 0.4 10*3/uL (ref 0.1–1.0)
Monocytes Relative: 6 %
NEUTROS PCT: 57 %
Neutro Abs: 3.7 10*3/uL (ref 1.7–7.7)

## 2019-02-11 LAB — CBC
HCT: 42.3 % (ref 36.0–46.0)
Hemoglobin: 14 g/dL (ref 12.0–15.0)
MCH: 29.4 pg (ref 26.0–34.0)
MCHC: 33.1 g/dL (ref 30.0–36.0)
MCV: 88.9 fL (ref 80.0–100.0)
PLATELETS: 300 10*3/uL (ref 150–400)
RBC: 4.76 MIL/uL (ref 3.87–5.11)
RDW: 12.4 % (ref 11.5–15.5)
WBC: 6.5 10*3/uL (ref 4.0–10.5)
nRBC: 0 % (ref 0.0–0.2)

## 2019-02-11 LAB — COMPREHENSIVE METABOLIC PANEL
ALT: 24 U/L (ref 0–44)
ANION GAP: 7 (ref 5–15)
AST: 19 U/L (ref 15–41)
Albumin: 4.1 g/dL (ref 3.5–5.0)
Alkaline Phosphatase: 49 U/L (ref 38–126)
BUN: 12 mg/dL (ref 6–20)
CO2: 24 mmol/L (ref 22–32)
Calcium: 9.5 mg/dL (ref 8.9–10.3)
Chloride: 107 mmol/L (ref 98–111)
Creatinine, Ser: 0.71 mg/dL (ref 0.44–1.00)
GFR calc Af Amer: >60 mL/min (ref >60)
GFR calc non Af Amer: >60 mL/min (ref >60)
Glucose, Bld: 99 mg/dL (ref 70–99)
POTASSIUM: 4.4 mmol/L (ref 3.5–5.1)
SODIUM: 138 mmol/L (ref 135–145)
Total Bilirubin: 1.1 mg/dL (ref 0.3–1.2)
Total Protein: 6.6 g/dL (ref 6.5–8.1)

## 2019-02-11 LAB — CBG MONITORING, ED: Glucose-Capillary: 83 mg/dL (ref 70–99)

## 2019-02-11 LAB — PROTIME-INR
INR: 1
Prothrombin Time: 13.1 seconds (ref 11.4–15.2)

## 2019-02-11 LAB — APTT: aPTT: 31 seconds (ref 24–36)

## 2019-02-11 MED ORDER — METOCLOPRAMIDE HCL 5 MG/ML IJ SOLN
10.0000 mg | Freq: Once | INTRAMUSCULAR | Status: AC
  Administered 2019-02-11: 10 mg via INTRAVENOUS
  Filled 2019-02-11: qty 2

## 2019-02-11 MED ORDER — SODIUM CHLORIDE 0.9% FLUSH
3.0000 mL | Freq: Once | INTRAVENOUS | Status: AC
  Administered 2019-02-11: 3 mL via INTRAVENOUS

## 2019-02-11 MED ORDER — DIPHENHYDRAMINE HCL 50 MG/ML IJ SOLN
25.0000 mg | Freq: Once | INTRAMUSCULAR | Status: AC
  Administered 2019-02-11: 25 mg via INTRAVENOUS
  Filled 2019-02-11: qty 1

## 2019-02-11 MED ORDER — GADOBUTROL 1 MMOL/ML IV SOLN
10.0000 mL | Freq: Once | INTRAVENOUS | Status: AC | PRN
  Administered 2019-02-11: 10 mL via INTRAVENOUS

## 2019-02-11 NOTE — Telephone Encounter (Signed)
Patient is not feeling good and needs to know if Dr Everlena Cooper still wants her to go to the ED if she is not feeling well. She states that she usually crashes pretty soon after feeling this way  Please call patient

## 2019-02-11 NOTE — ED Provider Notes (Signed)
28 year old female, complains of numbness and paresthesias on the right side of her body and her face.  This is intermittent, she is not currently having any symptoms.  She does not have a headache at this time and feels very good.  Her MRI is totally normal showing no signs of abnormal findings, there was a single cut with a small amount of flair which the neurologist as can be seen on normal brains.  The patient was made aware of all of these findings and the need to follow-up closely with neurology but no signs of multiple sclerosis, tumors, strokes or aneurysms.  Patient stable for discharge Medical screening examination/treatment/procedure(s) were conducted as a shared visit with non-physician practitioner(s) and myself.  I personally evaluated the patient during the encounter.  Clinical Impression:   Final diagnoses:  Complicated migraine         Eber Hong, MD 02/11/19 1842

## 2019-02-11 NOTE — ED Provider Notes (Signed)
Transfer of care occurred at approximately 4 PM from off going provider.  Plan of care at this time is wait on MRI results regarding right arm, right leg tingling numbness.  Patient has had previous MRI studies which were negative; followed by neurology however due to the symptoms and progression today patient was sent to the emergency department for evaluation.  Patient was given a headache cocktail by prior team. Resting comfortably reassessment, no acute distress.  Symptoms improving here in the emergency department.  MRI negative for any acute findings.  Patient discharged in stable edition was stable vital signs.  Patient is to follow-up with neurology.  The above care was discussed with upon by my attending physician.   Dahlia Client, MD 02/12/19 Angelica Coleman    Angelica Hong, MD 02/13/19 [phone]

## 2019-02-11 NOTE — Telephone Encounter (Signed)
Called and spoke with Pt. She states she was feeling faint, right side feeling numb. She went to the ED. She is presently in a room. Her BP is elevated. The ED is checking an EKG. I advised her we will check the ED notes

## 2019-02-11 NOTE — ED Triage Notes (Signed)
Pt reports feeling light headed and some tingling in the right side of her body. Was seen recently by neurologist for r/o stroke.

## 2019-02-11 NOTE — ED Provider Notes (Signed)
[facility] EMERGENCY DEPARTMENT Provider Note   CSN: [id number] Arrival date & time: 02/11/19  3419     History   Chief Complaint Chief Complaint  Patient presents with  . Weakness    HPI Angelica Coleman is a 28 y.o. female.  Patient currently under the care of Dr. Everlena Cooper at [facility] neurology --presents the emergency department today with numbness and paresthesias on the right side of her body and on her face.  Patient initially had symptoms involving R arm and hand while pregnant about 3 years ago.  She states that she did not really have a work-up, she was treated for complex migraines.  Patient states that her symptoms began to recur in the fall.  She continued to receive treatment for migraines as her symptoms are typically related to headaches.  She has been given Imitrex.  Patient followed up with her doctor and 12/2018.  She had an MRI performed which demonstrated "questionable small subacute infarct with parenchymal enhancement versus possible mycotic aneurysm with adjacent inflammatory changes versus other."  Her neurologist follow this up by ordering CT Angie of the head and neck which were negative.  Current plan is to follow-up with repeat MRI in 3 months and consider lumbar puncture for further testing if symptoms persist.  Patient has had a mild headache over the past 2 days.  Today while at work she developed some lightheadedness, a warm sensation in her right hand, decreased sensation in her right hand and anterior right leg along with some paresthesias of the face.  She does not feel weak today.  She has had no facial droop or difficulty walking.  No nausea or vomiting.  Headache symptoms are currently minimal.  She was told to go to the emergency department if she started feeling bad again.  She has had changes in her vision as well.  She states that her vision has mainly been blurry.  She denies any black spots in her vision or vision loss.     Past Medical  History:  Diagnosis Date  . Chronic bronchitis (HCC)    as a child  . Kidney stones     There are no active problems to display for this patient.   History reviewed. No pertinent surgical history.   OB History   No obstetric history on file.      Home Medications    Prior to Admission medications   Medication Sig Start Date End Date Taking? Authorizing Provider  ondansetron (ZOFRAN ODT) 8 MG disintegrating tablet Take 1 tablet (8 mg total) by mouth every 8 (eight) hours as needed for nausea or vomiting. 07/12/18   Donnetta Hutching, MD  propranolol ER (INDERAL LA) 60 MG 24 hr capsule Take 60 mg by mouth daily. 12/25/18   [provider]    Family History Family History  Problem Relation Age of Onset  . Hypertension Mother   . High Cholesterol Mother   . CVA Mother 63  . Hypertension Father   . High Cholesterol Father   . Diabetes Sister   . Hypertension Sister     Social History Social History   Tobacco Use  . Smoking status: Never Smoker  . Smokeless tobacco: Never Used  Substance Use Topics  . Alcohol use: Not on file  . Drug use: Not on file     Allergies   Percocet [oxycodone-acetaminophen] and Penicillins   Review of Systems Review of Systems  Constitutional: Negative for fever.  HENT: Negative for congestion,  dental problem, rhinorrhea and sinus pressure.   Eyes: Positive for visual disturbance. Negative for photophobia, discharge and redness.  Respiratory: Negative for shortness of breath.   Cardiovascular: Negative for chest pain.  Gastrointestinal: Negative for nausea and vomiting.  Musculoskeletal: Negative for gait problem, neck pain and neck stiffness.  Skin: Negative for rash.  Neurological: Positive for numbness (paresthesias) and headaches. Negative for syncope, speech difficulty, weakness and light-headedness.  Psychiatric/Behavioral: Negative for confusion.     Physical Exam Updated Vital Signs BP (!) 127/98 (BP Location: Left  Arm)   Pulse 65   Temp 98.4 F (36.9 C) (Oral)   Resp 18   LMP 02/11/2019   SpO2 98%   Physical Exam Vitals signs and nursing note reviewed.  Constitutional:      Appearance: She is well-developed.  HENT:     Head: Normocephalic and atraumatic.     Right Ear: Tympanic membrane, ear canal and external ear normal.     Left Ear: Tympanic membrane, ear canal and external ear normal.     Nose: Nose normal.     Mouth/Throat:     Pharynx: Uvula midline.  Eyes:     General: Lids are normal.     Extraocular Movements:     Right eye: No nystagmus.     Left eye: No nystagmus.     Conjunctiva/sclera: Conjunctivae normal.     Pupils: Pupils are equal, round, and reactive to light.  Neck:     Musculoskeletal: Normal range of motion and neck supple.  Cardiovascular:     Rate and Rhythm: Normal rate and regular rhythm.  Pulmonary:     Effort: Pulmonary effort is normal.     Breath sounds: Normal breath sounds.  Abdominal:     Palpations: Abdomen is soft.     Tenderness: There is no abdominal tenderness.  Musculoskeletal:     Cervical back: She exhibits normal range of motion, no tenderness and no bony tenderness.  Skin:    General: Skin is warm and dry.  Neurological:     Mental Status: She is alert and oriented to person, place, and time.     GCS: GCS eye subscore is 4. GCS verbal subscore is 5. GCS motor subscore is 6.     Cranial Nerves: No cranial nerve deficit.     Sensory: Sensory deficit present.     Coordination: Coordination normal.     Gait: Gait normal.     Deep Tendon Reflexes: Reflexes are normal and symmetric.     Comments: Patient reports decreased sensation and feeling of heat in her right hand.  She also reports decreased sensation in her anterior right lower and upper leg.      ED Treatments / Results  Labs (all labs ordered are listed, but only abnormal results are displayed) Labs Reviewed  PROTIME-INR  APTT  CBC  DIFFERENTIAL  COMPREHENSIVE METABOLIC  PANEL  CBG MONITORING, ED  I-STAT BETA HCG BLOOD, ED ([facility], [facility], [facility] ONLY)    EKG EKG Interpretation  Date/Time:  Monday February 11 2019 10:08:02 EST Ventricular Rate:  55 PR Interval:  142 QRS Duration: 98 QT Interval:  380 QTC Calculation: 363 R Axis:   51 Text Interpretation:  Sinus bradycardia with Premature atrial complexes Otherwise normal ECG Since prior ECG, rate has slowed Confirmed by Alvira Monday ([id number]) on 02/11/2019 10:56:16 AM   Radiology No results found.  Procedures Procedures (including critical care time)  Medications Ordered in ED Medications  sodium chloride flush (NS)  0.9 % injection 3 mL (has no administration in time range)  metoCLOPramide (REGLAN) injection 10 mg (10 mg Intravenous Given 02/11/19 1116)  diphenhydrAMINE (BENADRYL) injection 25 mg (25 mg Intravenous Given 02/11/19 1116)     Initial Impression / Assessment and Plan / ED Course  I have reviewed the triage vital signs and the nursing notes.  Pertinent labs & imaging results that were available during my care of the patient were reviewed by me and considered in my medical decision making (see chart for details).     Patient seen and examined. Work-up initiated. Medications ordered.   Vital signs reviewed and are as follows: BP (!) 130/95   Pulse (!) 52   Temp 98.4 F (36.9 C) (Oral)   Resp 15   LMP 02/11/2019   SpO2 97%   I spoke with Dr. Amada JupiterKirkpatrick regarding the patient's current symptoms and previous work-up.  He has reviewed neurology notes.  He recommends repeat imaging with MRI with and without contrast if she has any persistent symptoms.  This is ordered.  He also recommends treatment for complex migraine.  3:00 PM Patient continuing to hold in ED for MRI.   4:00 PM Handoff to oncoming team at shift change. Pt given something to eat and drink.   From a symptom standpoint, she is feeling better.   Final Clinical Impressions(s) / ED Diagnoses   Final diagnoses:  None     Pt with paresthesias, numbness, headaches. Pending MRI.   ED Discharge Orders    None       Renne CriglerGeiple, Ilena Dieckman, Cordelia Poche-C 02/11/19 1610    Eber HongMiller, Brian, MD 02/11/19 [phone]

## 2019-02-11 NOTE — Discharge Instructions (Signed)
Your testing today looks great, there is no signs of brain tumors aneurysms or strokes.  There is no signs of multiple sclerosis.  Please be aware that you will need to follow-up closely with your neurologist this week but return to the emergency department for severe or worsening symptoms.

## 2019-02-11 NOTE — ED Notes (Signed)
Pt transported to MRI 

## 2019-02-15 ENCOUNTER — Other Ambulatory Visit: Payer: Self-pay

## 2019-02-15 MED ORDER — UBROGEPANT 50 MG PO TABS: 50.0000 mg | ORAL_TABLET | ORAL | 0 refills | Status: DC | PRN

## 2019-02-15 MED ORDER — ERENUMAB-AOOE 70 MG/ML ~~LOC~~ SOAJ: 70.0000 mg | Freq: Once | SUBCUTANEOUS | 0 refills | Status: AC

## 2019-02-15 MED ORDER — ERENUMAB-AOOE 70 MG/ML ~~LOC~~ SOAJ: 70.0000 mg | SUBCUTANEOUS | 11 refills | Status: DC

## 2019-03-27 ENCOUNTER — Other Ambulatory Visit: Payer: 59

## 2019-04-18 ENCOUNTER — Telehealth: Payer: Self-pay | Admitting: Neurology

## 2019-04-18 NOTE — Telephone Encounter (Signed)
Patient left VM about wanting some ubrevyl- samples if possible for her Migraines. She also wanted to speak with you about an ins letter for PA for next MRI- she said she needed to ask about the Dr.'s

## 2019-04-22 MED ORDER — UBROGEPANT 100 MG PO TABS: 100.0000 mg | ORAL_TABLET | ORAL | 3 refills | Status: DC | PRN

## 2019-04-22 NOTE — Telephone Encounter (Signed)
Called and spoke with Pt. She has a copay card for Ubrelvy and the samples were effective. She would like an Rx. Confirmed pharmacy. Pt will call back about MRI if needed.

## 2019-04-26 ENCOUNTER — Encounter: Payer: Self-pay | Admitting: *Deleted

## 2019-04-26 NOTE — Progress Notes (Signed)
Angelica Coleman (Key: [id number])  Rx #: [id number]  Angelica Coleman 100MG  tablets  Form [employer] Electronic Prior Authorization Form (2017 NCPDP)  Created  8 hours ago  Sent to Plan  16 minutes ago  Plan Response  16 minutes ago  Submit Clinical Questions  8 minutes ago  Determination  Unfavorable  4 minutes ago  Message from Plan Request Reference Number: [id number]. UBRELVY TAB 100MG  is denied for not meeting the prior authorization requirement(s). For further questions, call [phone]. Appeals are not supported through ePA. Please refer to the fax case notice for appeals information and instructions.

## 2019-04-26 NOTE — Progress Notes (Signed)
Angelica Coleman (Key: [id number])  Rx #: [id number]  [name] 100MG  tablets  Form [employer] Electronic Prior Authorization Form (2017 NCPDP)  Created  8 hours ago  Sent to Plan  8 minutes ago  Plan Response  8 minutes ago  Submit Clinical Questions  less than a minute ago  Determination  Wait for Determination Please wait for [employer] 2017 NCPDP to return a determination.

## 2019-05-02 ENCOUNTER — Encounter: Payer: Self-pay | Admitting: *Deleted

## 2019-05-02 NOTE — Progress Notes (Signed)
Angelica Coleman (Key: Barbaraann Faster)  Ubrelvy 100MG  tablets  Form Midwife Form  Created  1 hour ago  Sent to Plan  1 minute ago  Determination  Wait for Determination Please wait for the payer to return a determination.   This form is used to appeal a previously denied prior authorization request. To submit an appeal, you can call [phone] or complete and submit the form below.

## 2019-05-02 NOTE — Progress Notes (Addendum)
Angelica Coleman (Key: [id number])  Rx #: [id number]  Aimovig 70MG /ML auto-injectors  Form [employer] Electronic Prior Authorization Form (2017 NCPDP)  Created  14 days ago  Sent to Plan  1 hour ago  Plan Response  1 hour ago  Submit Clinical Questions  1 hour ago  Determination  Favorable  2 hours ago  Message from Plan Request Reference Number: [id number]. AIMOVIG INJ 70MG /ML is approved through 08/02/2019. For further questions, call [phone].

## 2019-05-07 NOTE — Progress Notes (Signed)
Called [phone] was transferred 6 times only those calls I received information are listed below: Ist person Charlotte Sanes said the medication was a "point of exclusion" and "not covered by her plan".  Transferred to [phone] [employer] appeals spoke with Wynona Canes said said "she has a restricted plan". Finally spoke with Shawn call reference# [id number] case#[id number] said it was "case appeal was upheld denied on 05/03/2019" He said a letter was sent and he will refax the letter today 05/07/2019

## 2019-05-22 ENCOUNTER — Ambulatory Visit
Admission: RE | Admit: 2019-05-22 | Discharge: 2019-05-22 | Disposition: A | Discharge status: Home / Self Care | Payer: 59 | Source: Ambulatory Visit | Attending: Neurology | Admitting: Neurology

## 2019-05-22 ENCOUNTER — Other Ambulatory Visit: Payer: Self-pay

## 2019-05-22 DIAGNOSIS — R9089 Other abnormal findings on diagnostic imaging of central nervous system: Secondary | ICD-10-CM

## 2019-05-22 DIAGNOSIS — R2990 Unspecified symptoms and signs involving the nervous system: Secondary | ICD-10-CM

## 2019-05-22 MED ORDER — GADOBENATE DIMEGLUMINE 529 MG/ML IV SOLN
18.0000 mL | Freq: Once | INTRAVENOUS | Status: AC | PRN
  Administered 2019-05-22: 12:00:00 18 mL via INTRAVENOUS

## 2019-05-27 ENCOUNTER — Encounter: Payer: Self-pay | Admitting: Neurology

## 2019-05-27 ENCOUNTER — Telehealth: Payer: Self-pay

## 2019-05-27 NOTE — Telephone Encounter (Signed)
-----   Message from Drema Dallas, DO sent at 05/24/2019  7:36 AM EDT ----- MRI of brain looks unchanged.  One tiny spot that looks unremarkable and likely of no clinical significance.

## 2019-05-27 NOTE — Telephone Encounter (Signed)
Called and advised Pt of results. Also confirmed Pt info for 05/28/19 virtual visit.  Pt is using co-pay cards for Ubrelvy 100 mg and Aimovig due to expense. She does not run through her insurance. Aimovig was approved, but co-pay too expensive.

## 2019-05-27 NOTE — Progress Notes (Signed)
Virtual Visit via Video Note The purpose of this virtual visit is to provide medical care while limiting exposure to the novel coronavirus.    Consent was obtained for video visit:  Yes.   Answered questions that patient had about telehealth interaction:  Yes.   I discussed the limitations, risks, security and privacy concerns of performing an evaluation and management service by telemedicine. I also discussed with the patient that there may be a patient responsible charge related to this service. The patient expressed understanding and agreed to proceed.  Pt location: Home Physician Location: office Name of referring provider:  Lindaann Pascal, PA-C I connected with Levi Aland at patients initiation/request on 05/28/2019 at  1:30 PM EDT by video enabled telemedicine application and verified that I am speaking with the correct person using two identifiers. Pt MRN:  [id number] Pt DOB:  [birth year] Video Participants:  Levi Aland   History of Present Illness:  Angelica Coleman is a 28 year old left-handed woman with mitral regurgitation, palpitations and history of recurrent kidney stones who follows up for migraine with aura.  UPDATE: Due to findings on brain MRI from January, she underwent workup.  CTA of head and neck from 01/29/19 personally reviewed showed mild cervical ICA tortuosity at the C2 level but otherwise no aneurysm or vascular occlusion or stenosis.  She presented to the ED on 02/11/19 for right sided numbness and tingling and lightheadedness.  MRI of brain with and without contrast was personally reviewed and demonstrated a single nonspecific hyperintense T2 focus in the left parietal white matter.  She was treated with a headache cocktail.  Repeat MRI of brain with and without contrast from 05/22/19 was personally reviewed and was unchanged.  She has had a few migraines without paresthesias.  They are severe left parietal pain, lasting up to 30 minutes with Ubrelvy but headache may  return next day, occurring over 15 days a month, worse during her menstrual cycle or last week prior to next Aimovig injection.  She had one complicated migraine with paresthesias last week, lasting 4 days, the first since February.    Current NSAID:  Naproxen 550mg  Current analgesic:  None Current triptan:  None Current antihypertensive:  Propranolol ER 60mg   Current antidepressant:  Nortriptyline 10mg . Current anti-CGRP:  Aimovig 70mg  (preventative), Ubrelvy (abortive)  Caffeine: No Exercise: Trying to exercise Depression: No; Anxiety: Some Sleep hygiene: Good.  HISTORY: In 2016, while pregnant with her son, she developed numbness and tingling of her hands and nose associated with vision loss in both eyes described as only seeing the outline of whatever she looks at with blurred vision. Numbness lasts 20 minutes but visual disturbance lasts longer. It is followed by severe pounding headache on crown lasting into the next day. It is associated with nausea, photophobia but no unilateral weakness. It is aggravated by looking at computer screens or fluorescent lights. It is relieved by resting in dark room.  In September 2019, she got home from work and had another episode. She went to bed and when she woke up, she was fine. Later the next day, symptoms returned. She had associated fatigue. Symptoms lasted about 20 minutes and followed by the headache. She went to Urgent Care and was referred to neurology.  In January 2020, she woke up with excruciating headache.  Sumatriptan effective after 2nd dose.  She developed numbness in the right hand.  The next day, she woke up and her right hand and forearm was still numb.  She went  to work and she had trouble using the keyboard on her computer due to difficulty with her fingers in the right hand.  That night, it started hurting.  Then on Wednesday, she was well.  On Thursday, she developed sudden right sided numbness face, arm, torso and  leg, which lasted about 24 hours.    She was having trouble with memory such as names and events.  She had an MRI of brain with and without contrast performed on 01/08/19, which demonstrated 3 mm enhancing nodular focus in the superficial left parietal region with small amount of adjacent parenchymal T2/FLAIR hyperintensity.  Differential mentioned includes "questionable small subacute infarct with parenchymal enhancement versus possible mycotic aneurysm with adjacent inflammatory changes versus other."  Imaging not available to personally review.  She was started on ASA 325mg  daily by her PCP for possible stroke.   She was feeling fatigued for the next couple of weeks.  No fevers.  She started feeling better last week.  Every now and then she notes random bursts of scalp neuralgia (burning/needles) for 5 minutes.    She took ibuprofen 200mg  which was ineffective. 600mg  helped to at least go to sleep. She did not tolerate Excedrin.  She previously took Toprol XL 25mg  for palpitations. Palpitations have been stronger lately and usually occur during her menstrual cycle.  Family history: Mother had stroke in her 5140s. She also once had a migraine with stroke-like symptoms but no prior history of migraines.  Her uncle also had a migraine with stroke-like symptoms as well.  Past NSAIDS: ibuprofen Past antihypertensive:  Toprol XL (palpitations) Past antidepressant:  Nortriptyline (nightmares)  Past Medical History: Past Medical History:  Diagnosis Date  . Chronic bronchitis (HCC)    as a child  . Kidney stones     Medications: Outpatient Encounter Medications as of 05/28/2019  Medication Sig  . Erenumab-aooe (AIMOVIG) 70 MG/ML SOAJ Inject 70 mg into the skin every 30 (thirty) days.  . naproxen sodium (ANAPROX) 550 MG tablet Take 550 mg by mouth as needed for headache.  . ondansetron (ZOFRAN ODT) 8 MG disintegrating tablet Take 1 tablet (8 mg total) by mouth every 8 (eight) hours as needed for  nausea or vomiting.  . propranolol ER (INDERAL LA) 60 MG 24 hr capsule Take 60 mg by mouth daily.  Marland Kitchen. Ubrogepant (UBRELVY) 100 MG TABS Take 100 mg by mouth every 2 (two) hours as needed. Max 200 mg 24 hrs  . Ubrogepant (UBRELVY) 50 MG TABS Take 50 mg by mouth every 2 (two) hours as needed.   No facility-administered encounter medications on file as of 05/28/2019.     Allergies: Allergies  Allergen Reactions  . Percocet [Oxycodone-Acetaminophen] Other (See Comments)    Pt reports fainting  . Penicillins Hives    Has patient had a PCN reaction causing immediate rash, facial/tongue/throat swelling, SOB or lightheadedness with hypotension: Yes Has patient had a PCN reaction causing severe rash involving mucus membranes or skin necrosis: No Has patient had a PCN reaction that required hospitalization: No Has patient had a PCN reaction occurring within the last 10 years: Yes If all of the above answers are "NO", then may proceed with Cephalosporin use.     Family History: Family History  Problem Relation Age of Onset  . Hypertension Mother   . High Cholesterol Mother   . CVA Mother [id number]  . Hypertension Father   . High Cholesterol Father   . Diabetes Sister   . Hypertension Sister  Social History: Social History   Socioeconomic History  . Marital status: Married    Spouse name: Josh  . Number of children: 2  . Years of education: Not on file  . Highest education level: Some college, no degree  Occupational History  . Occupation: Event organiser: Advertising copywriter  Social Needs  . Financial resource strain: Not on file  . Food insecurity:    Worry: Not on file    Inability: Not on file  . Transportation needs:    Medical: Not on file    Non-medical: Not on file  Tobacco Use  . Smoking status: Never Smoker  . Smokeless tobacco: Never Used  Substance and Sexual Activity  . Alcohol use: Not on file  . Drug use: Not on file  . Sexual activity: Not on file  Lifestyle   . Physical activity:    Days per week: Not on file    Minutes per session: Not on file  . Stress: Not on file  Relationships  . Social connections:    Talks on phone: Not on file    Gets together: Not on file    Attends religious service: Not on file    Active member of club or organization: Not on file    Attends meetings of clubs or organizations: Not on file    Relationship status: Not on file  . Intimate partner violence:    Fear of current or ex partner: Not on file    Emotionally abused: Not on file    Physically abused: Not on file    Forced sexual activity: Not on file  Other Topics Concern  . Not on file  Social History Narrative   Patient is left-handed. She lives with her husband and 2 children. She avoids caffeine. She was exercising 5 x week until her son's school schedule changes recently.    Observations/Objective:   Height  (1.575 m), weight 195 lb (88.5 kg). No acute distress.  Alert and oriented.  Speech fluent and not dysarthric.  Language intact.  Face symmetric.  Assessment and Plan:   1.  Migraine with aura, without status migrainosus, not intractable  1.  For preventative management, increase Aimovig to  monthly.  If ineffective after 2 months, would switch to either Ajovy or Emgality. 2.  For abortive therapy, Ubrelvy 3.  Limit use of pain relievers to no more than 2 days out of week to prevent risk of rebound or medication-overuse headache. 4.  Keep headache diary 5.  Exercise, hydration, caffeine cessation, sleep hygiene, monitor for and avoid triggers 6.  Consider:  magnesium citrate  daily, riboflavin  daily, and coenzyme Q10  three times daily 7. Always keep in mind that currently taking a hormone or birth control may be a possible trigger or aggravating factor for migraine. 8. Follow up in 4 months.  Follow Up Instructions:    -I discussed the assessment and treatment plan with the patient. The patient was provided an  opportunity to ask questions and all were answered. The patient agreed with the plan and demonstrated an understanding of the instructions.   The patient was advised to call back or seek an in-person evaluation if the symptoms worsen or if the condition fails to improve as anticipated.  Cira Servant, DO

## 2019-05-27 NOTE — Progress Notes (Signed)
Pt is still using co-pay card due to the co-pay with her insurance being so expensive.

## 2019-05-27 NOTE — Progress Notes (Signed)
Pt is using a manufacturer co-pay card

## 2019-05-28 ENCOUNTER — Other Ambulatory Visit: Payer: Self-pay

## 2019-05-28 ENCOUNTER — Encounter: Payer: Self-pay | Admitting: Neurology

## 2019-05-28 ENCOUNTER — Telehealth (INDEPENDENT_AMBULATORY_CARE_PROVIDER_SITE_OTHER): Payer: 59 | Admitting: Neurology

## 2019-05-28 VITALS — Ht 62.0 in | Wt 195.0 lb

## 2019-05-28 DIAGNOSIS — G43109 Migraine with aura, not intractable, without status migrainosus: Secondary | ICD-10-CM

## 2019-05-28 MED ORDER — ERENUMAB-AOOE 140 MG/ML ~~LOC~~ SOAJ: 140.0000 mg | SUBCUTANEOUS | 5 refills | Status: DC

## 2019-07-07 ENCOUNTER — Encounter (HOSPITAL_COMMUNITY): Payer: Self-pay | Admitting: Obstetrics and Gynecology

## 2019-07-07 ENCOUNTER — Other Ambulatory Visit: Payer: Self-pay

## 2019-07-07 ENCOUNTER — Emergency Department (HOSPITAL_COMMUNITY)
Admission: EM | Admit: 2019-07-07 | Discharge: 2019-07-07 | Disposition: A | Discharge status: Home / Self Care | Payer: 59 | Attending: Emergency Medicine | Admitting: Emergency Medicine

## 2019-07-07 DIAGNOSIS — Z79899 Other long term (current) drug therapy: Secondary | ICD-10-CM | POA: Insufficient documentation

## 2019-07-07 DIAGNOSIS — M79605 Pain in left leg: Secondary | ICD-10-CM | POA: Diagnosis present

## 2019-07-07 NOTE — ED Triage Notes (Signed)
Pt reports pain in the left leg. Pt reports it feels like pins and needles, pt denies being a diabetic. Pt reports she fell down a waterfall on July 4th. Pt reports she is having a lot of pain when the leg is down. Pt called the triage nurse hotline and was told she may need an MRI and to come to the ER.

## 2019-07-07 NOTE — ED Provider Notes (Signed)
[facility]-EMERGENCY DEPT Provider Note   CSN: [id number] Arrival date & time: 07/07/19  1129     History   Chief Complaint Chief Complaint  Patient presents with  . Leg Pain    HPI Angelica Coleman is a 28 y.o. female.     28 year old female complains of paresthesias to her left shin.  Patient fell a week ago and had a contusion area which has been healing.  According to her, had negative plain x-rays.  Denies any distal numbness or tingling to her left foot.  No pain to her hip.  Paresthesias are persistent and nothing makes them better or worse.  Called nurse helpline and told to come here     Past Medical History:  Diagnosis Date  . Chronic bronchitis (HCC)    as a child  . Kidney stones     There are no active problems to display for this patient.   Past Surgical History:  Procedure Laterality Date  . ECTOPIC PREGNANCY SURGERY    . kidney stone removal       OB History    Gravida      Para      Term      Preterm      AB      Living  2     SAB      TAB      Ectopic      Multiple      Live Births               Home Medications    Prior to Admission medications   Medication Sig Start Date End Date Taking? Authorizing Provider  Erenumab-aooe (AIMOVIG) 140 MG/ML SOAJ Inject 140 mg into the skin every 30 (thirty) days. 05/28/19   Drema DallasJaffe, Adam R, DO  naproxen sodium (ANAPROX) 550 MG tablet Take 550 mg by mouth as needed for headache.    [provider]  ondansetron (ZOFRAN ODT) 8 MG disintegrating tablet Take 1 tablet (8 mg total) by mouth every 8 (eight) hours as needed for nausea or vomiting. 07/12/18   Donnetta Hutchingook, Brian, MD  propranolol ER (INDERAL LA) 60 MG 24 hr capsule Take 60 mg by mouth daily. 12/25/18   [provider]  Ubrogepant (UBRELVY) 100 MG TABS Take 100 mg by mouth every 2 (two) hours as needed. Max 200 mg 24 hrs Patient taking differently: Take 100 mg by mouth every 2 (two) hours as needed. Max  200 mg 24 hrs Pt using co-pay card 04/22/19   Drema DallasJaffe, Adam R, DO    Family History Family History  Problem Relation Age of Onset  . Hypertension Mother   . High Cholesterol Mother   . CVA Mother [id number]  . Hypertension Father   . High Cholesterol Father   . Diabetes Sister   . Hypertension Sister     Social History Social History   Tobacco Use  . Smoking status: Never Smoker  . Smokeless tobacco: Never Used  Substance Use Topics  . Alcohol use: Not Currently  . Drug use: Never     Allergies   Percocet [oxycodone-acetaminophen] and Penicillins   Review of Systems Review of Systems  All other systems reviewed and are negative.    Physical Exam Updated Vital Signs BP (!) 123/102 (BP Location: Left Arm)   Pulse 92   Temp 98.6 F (37 C) (Oral)   Resp 18   LMP 06/16/2019 (Approximate)   SpO2 99%   Physical Exam  Vitals signs and nursing note reviewed.  Constitutional:      General: She is not in acute distress.    Appearance: Normal appearance. She is well-developed. She is not toxic-appearing.  HENT:     Head: Normocephalic and atraumatic.  Eyes:     General: Lids are normal.     Conjunctiva/sclera: Conjunctivae normal.     Pupils: Pupils are equal, round, and reactive to light.  Neck:     Musculoskeletal: Normal range of motion and neck supple.     Thyroid: No thyroid mass.     Trachea: No tracheal deviation.  Cardiovascular:     Rate and Rhythm: Normal rate and regular rhythm.     Heart sounds: Normal heart sounds. No murmur. No gallop.   Pulmonary:     Effort: Pulmonary effort is normal. No respiratory distress.     Breath sounds: Normal breath sounds. No stridor. No decreased breath sounds, wheezing, rhonchi or rales.  Abdominal:     General: Bowel sounds are normal. There is no distension.     Palpations: Abdomen is soft.     Tenderness: There is no abdominal tenderness. There is no rebound.  Musculoskeletal: Normal range of motion.        General: No  tenderness.       Legs:     Comments: Neurovascular status intact at left foot.  Left calf compartment soft.  Healing ecchymosis noted  Skin:    General: Skin is warm and dry.     Findings: No abrasion or rash.  Neurological:     Mental Status: She is alert and oriented to person, place, and time.     GCS: GCS eye subscore is 4. GCS verbal subscore is 5. GCS motor subscore is 6.     Cranial Nerves: No cranial nerve deficit.     Sensory: No sensory deficit.  Psychiatric:        Speech: Speech normal.        Behavior: Behavior normal.      ED Treatments / Results  Labs (all labs ordered are listed, but only abnormal results are displayed) Labs Reviewed - No data to display  EKG None  Radiology No results found.  Procedures Procedures (including critical care time)  Medications Ordered in ED Medications - No data to display   Initial Impression / Assessment and Plan / ED Course  I have reviewed the triage vital signs and the nursing notes.  Pertinent labs & imaging results that were available during my care of the patient were reviewed by me and considered in my medical decision making (see chart for details).        Patient without signs of compartment syndrome.  Neurovascular intact at left foot.  Suspect that nerve is compressed likely from underlying hematoma.  Return precautions given  Final Clinical Impressions(s) / ED Diagnoses   Final diagnoses:  None    ED Discharge Orders    None       Lacretia Leigh, MD 07/07/19 1250

## 2019-07-07 NOTE — Discharge Instructions (Addendum)
You likely have stretched the nerve in your leg or it is being compressed by a hematoma in your calf.  Continue what you are doing right now but return here at once if you developed weakness in your foot, severe pain, or any other problems

## 2019-07-17 NOTE — Progress Notes (Signed)
Aimovig 70mg /ml prior authorization submitted via covermymeds.  Waiting response. Key [id number]

## 2019-09-05 ENCOUNTER — Telehealth: Payer: Self-pay | Admitting: Neurology

## 2019-09-05 NOTE — Telephone Encounter (Signed)
The following message was left with [facility] on 09/05/2019 at 12:36 PM, Berton Mount, RN.  Caller states she has a migraine, rescue pills is not making a difference. Angelica Coleman states she developed another Migraine headache this morning (currently rated as a 10 on the 1 to 10 scale). No fever. No neck stiffness. Alert and responsive. She states this is the second worst headache of her life and that it came on rapidly today.  Nurse told the patient to go the the ED now.

## 2019-09-24 NOTE — Telephone Encounter (Signed)
Sending to clinical staff for review: Okay to sign/close encounter or is further follow up needed? ° °

## 2019-09-24 NOTE — Telephone Encounter (Signed)
Front desk sent this message to see if can close out - was from on call nurse two weeks ago when they recommended patient go to the ER.   Dr. Tomi Likens - No notes since that call in chart (no ER notes in Epic). Any follow up from [place] needed for patient?

## 2019-09-24 NOTE — Telephone Encounter (Signed)
We can contact her with update. It looks like she has an appointment next week.

## 2019-09-25 NOTE — Telephone Encounter (Signed)
Left message stating we were just following up on how she was doing (see phone nurse note below directing her to ER for migraine on 09/05/19). Reminded patient she has a follow up appt next week with our office and we will see her then unless any concerns before she is welcome to call.

## 2019-09-27 NOTE — Progress Notes (Signed)
NEUROLOGY FOLLOW UP OFFICE NOTE  Angelica Coleman [id number]  HISTORY OF PRESENT ILLNESS: Angelica Coleman is a 28 year old left-handed woman withmitral regurgitation, palpitations and history of recurrent kidney stones who follows up for migraine with aura.  UPDATE: Last visit, Aimovig was increased to 140mg  monthly.  However, she developed a rash and I suggested changing to .  She is not on propranolol ER 60mg  any longer.  Ubrelvy ineffective.  Intensity:  Moderate to severe Duration:  Usually within an hour.  Frequency:  5 minor migraines.  At least one severe migraine a month (usually just prior to menstrual cycle)  Current NSAID: Naproxen 550mg  Current analgesic:None Current triptan: None Current antihypertensive: None  Current antidepressant: None Current anti-CGRP:  Ubrelvy 100mg  (abortive)  Caffeine: No Exercise: Trying to exercise Depression: No; Anxiety: Some Sleep hygiene: Good.  HISTORY: In 2016, while pregnant with her son, she developed numbness and tingling of her hands and nose associated with vision loss in both eyes described as only seeing the outline of whatever she looks at with blurred vision. Numbness lasts 20 minutes but visual disturbance lasts longer. It is followed by severe pounding headache on crown lasting into the next day. It is associated with nausea, photophobia but no unilateral weakness. It is aggravated by looking at computer screens or fluorescent lights. It is relieved by resting in dark room.  In September 2019, she got home from work and had another episode. She went to bed and when she woke up, she was fine. Later the next day, symptoms returned. She had associated fatigue. Symptoms lasted about 20 minutes and followed by the headache. She went to Urgent Care and was referred to neurology.  In January 2020, she woke up with excruciating headache. Sumatriptan effective after 2nd dose. She developed numbness in  the right hand. The next day, she woke up and her right hand and forearm was still numb. She went to work and she had trouble using the keyboard on her computer due to difficulty with her fingers in the right hand. That night, it started hurting. Then on Wednesday, she was well. On Thursday, she developed sudden right sided numbness face, arm, torso and leg, which lasted about 24 hours. She was having trouble with memory such as names and events. She had an MRI of brain with and without contrast performed on 01/08/19, which demonstrated 3 mm enhancing nodular focus in the superficial left parietal region with small amount of adjacent parenchymal T2/FLAIR hyperintensity. Differential mentioned includes "questionable small subacute infarct with parenchymal enhancement versus possible mycotic aneurysm with adjacent inflammatory changes versus other." Imaging not available to personally review. She was started on ASA 325mg  daily by her PCP for possible stroke.  She was feeling fatigued for the next couple of weeks. No fevers. She started feeling better last week. Every now and then she notes random bursts of scalp neuralgia (burning/needles) for 5 minutes. Due to findings on brain MRI from January, she underwent workup.  CTA of head and neck from 01/29/19 personally reviewed showed mild cervical ICA tortuosity at the C2 level but otherwise no aneurysm or vascular occlusion or stenosis.  She presented to the ED on 02/11/19 for right sided numbness and tingling and lightheadedness.  MRI of brain with and without contrast was personally reviewed and demonstrated a single nonspecific hyperintense T2 focus in the left parietal white matter.  She was treated with a headache cocktail.  Repeat MRI of brain with and without contrast from 05/22/19 was  personally reviewed and was unchanged.  She took ibuprofen  which was ineffective.  helped to at least go to sleep. She did not tolerate  Excedrin.  Shepreviously tookToprol XL  for palpitations. Palpitations have been stronger lately and usually occur during her menstrual cycle.  Family history: Mother had stroke in her 74s. She also once had a migraine with stroke-like symptoms but no prior history of migraines.  Her uncle also had a migraine with stroke-like symptoms as well.  Past NSAIDS: ibuprofen Past antihypertensive:  Toprol XL (palpitations), propranolol ER  (palpitations) Past antidepressant: Nortriptyline (nightmares) Past anti-CGRP:  Aimovig  (effective, hives) Contraindicated medications:  Triptans (stroke-like symptoms with migraine); topiramate (history of recurrent kidney stones)  PAST MEDICAL HISTORY: Past Medical History:  Diagnosis Date  . Chronic bronchitis (HCC)    as a child  . Kidney stones     MEDICATIONS: Current Outpatient Medications on File Prior to Visit  Medication Sig Dispense Refill  . Erenumab-aooe (AIMOVIG) 140 MG/ML SOAJ Inject 140 mg into the skin every 30 (thirty) days. 1 pen 5  . naproxen sodium (ANAPROX) 550 MG tablet Take 550 mg by mouth as needed for headache.    . ondansetron (ZOFRAN ODT) 8 MG disintegrating tablet Take 1 tablet (8 mg total) by mouth every 8 (eight) hours as needed for nausea or vomiting. 15 tablet 0  . propranolol ER (INDERAL LA) 60 MG 24 hr capsule Take 60 mg by mouth daily.    Marland Kitchen Ubrogepant (UBRELVY) 100 MG TABS Take 100 mg by mouth every 2 (two) hours as needed. Max 200 mg 24 hrs (Patient taking differently: Take 100 mg by mouth every 2 (two) hours as needed. Max 200 mg 24 hrs Pt using co-pay card) 9 tablet 3   No current facility-administered medications on file prior to visit.     ALLERGIES: Allergies  Allergen Reactions  . Percocet [Oxycodone-Acetaminophen] Other (See Comments)    Pt reports fainting  . Penicillins Hives    Has patient had a PCN reaction causing immediate rash, facial/tongue/throat swelling, SOB or  lightheadedness with hypotension: Yes Has patient had a PCN reaction causing severe rash involving mucus membranes or skin necrosis: No Has patient had a PCN reaction that required hospitalization: No Has patient had a PCN reaction occurring within the last 10 years: Yes If all of the above answers are "NO", then may proceed with Cephalosporin use.     FAMILY HISTORY: Family History  Problem Relation Age of Onset  . Hypertension Mother   . High Cholesterol Mother   . CVA Mother 17  . Hypertension Father   . High Cholesterol Father   . Diabetes Sister   . Hypertension Sister   .  SOCIAL HISTORY: Social History   Socioeconomic History  . Marital status: Married    Spouse name: Josh  . Number of children: 2  . Years of education: Not on file  . Highest education level: Some college, no degree  Occupational History  . Occupation: Event organiser: Advertising copywriter  Social Needs  . Financial resource strain: Not on file  . Food insecurity    Worry: Not on file    Inability: Not on file  . Transportation needs    Medical: Not on file    Non-medical: Not on file  Tobacco Use  . Smoking status: Never Smoker  . Smokeless tobacco: Never Used  Substance and Sexual Activity  . Alcohol use: Not Currently  . Drug use: Never  .  Sexual activity: Yes    Birth control/protection: None  Lifestyle  . Physical activity    Days per week: Not on file    Minutes per session: Not on file  . Stress: Not on file  Relationships  . Social Herbalist on phone: Not on file    Gets together: Not on file    Attends religious service: Not on file    Active member of club or organization: Not on file    Attends meetings of clubs or organizations: Not on file    Relationship status: Not on file  . Intimate partner violence    Fear of current or ex partner: Not on file    Emotionally abused: Not on file    Physically abused: Not on file    Forced sexual activity: Not on file   Other Topics Concern  . Not on file  Social History Narrative   Patient is left-handed. She lives with her husband and 2 children. She avoids caffeine. She was exercising 5 x week until her son's school schedule changes recently.     REVIEW OF SYSTEMS: Constitutional: No fevers, chills, or sweats, no generalized fatigue, change in appetite Eyes: No visual changes, double vision, eye pain Ear, nose and throat: No hearing loss, ear pain, nasal congestion, sore throat Cardiovascular: No chest pain, palpitations Respiratory:  No shortness of breath at rest or with exertion, wheezes GastrointestinaI: No nausea, vomiting, diarrhea, abdominal pain, fecal incontinence Genitourinary:  No dysuria, urinary retention or frequency Musculoskeletal:  No neck pain, back pain Integumentary: No rash, pruritus, skin lesions Neurological: as above Psychiatric: No depression, insomnia, anxiety Endocrine: No palpitations, fatigue, diaphoresis, mood swings, change in appetite, change in weight, increased thirst Hematologic/Lymphatic:  No purpura, petechiae. Allergic/Immunologic: no itchy/runny eyes, nasal congestion, recent allergic reactions, rashes  PHYSICAL EXAM: Blood pressure 118/85, pulse 76, temperature (!) 97.5 F (36.4 C), height 5\' 2"  (1.575 m), weight 197 lb 6.4 oz (89.5 kg), SpO2 94 %. General: No acute distress.  Patient appears well-groomed.   Head:  Normocephalic/atraumatic Eyes:  Fundi examined but not visualized Neck: supple, no paraspinal tenderness, full range of motion Heart:  Regular rate and rhythm Lungs:  Clear to auscultation bilaterally Back: No paraspinal tenderness Neurological Exam: alert and oriented to person, place, and time. Attention span and concentration intact, recent and remote memory intact, fund of knowledge intact.  Speech fluent and not dysarthric, language intact.  CN II-XII intact. Bulk and tone normal, muscle strength 5/5 throughout.  Sensation to light touch,  temperature and vibration intact.  Deep tendon reflexes 2+ throughout, toes downgoing.  Finger to nose and heel to shin testing intact.  Gait normal, Romberg negative.  IMPRESSION: 1.  Migraine with aura, without status migrainosus, not intractable   PLAN: 1.  For preventative management, will start Emgality 2.  For abortive therapy, Ubrelvy.  If she should have another migraine that does not respond to [place], consider [place] as second line. 3.  Limit use of pain relievers to no more than 2 days out of week to prevent risk of rebound or medication-overuse headache. 4.  Keep headache diary 5.  Exercise, hydration, caffeine cessation, sleep hygiene, monitor for and avoid triggers 6.  Consider:  magnesium citrate 400mg  daily, riboflavin 400mg  daily, and coenzyme Q10 100mg  three times daily 7. Always keep in mind that currently taking a hormone or birth control may be a possible trigger or aggravating factor for migraine. 8. Follow up 4 months  Shon MilletAdam , DO  CC: Lindaann PascalScott Long, PA-C

## 2019-09-30 ENCOUNTER — Ambulatory Visit (INDEPENDENT_AMBULATORY_CARE_PROVIDER_SITE_OTHER): Payer: 59 | Admitting: Neurology

## 2019-09-30 ENCOUNTER — Encounter: Payer: Self-pay | Admitting: *Deleted

## 2019-09-30 ENCOUNTER — Other Ambulatory Visit: Payer: Self-pay

## 2019-09-30 ENCOUNTER — Encounter: Payer: Self-pay | Admitting: Neurology

## 2019-09-30 VITALS — BP 118/85 | HR 76 | Temp 97.5°F | Ht 62.0 in | Wt 197.4 lb

## 2019-09-30 DIAGNOSIS — G43109 Migraine with aura, not intractable, without status migrainosus: Secondary | ICD-10-CM

## 2019-09-30 MED ORDER — EMGALITY 120 MG/ML ~~LOC~~ SOAJ: 120.0000 mg | SUBCUTANEOUS | 11 refills | Status: DC

## 2019-09-30 MED ORDER — EMGALITY 120 MG/ML ~~LOC~~ SOAJ: 240.0000 mg | SUBCUTANEOUS | 0 refills | Status: DC

## 2019-09-30 NOTE — Progress Notes (Signed)
Patient prescribed Emaglity today but did not want PA initiated as the cost would still be high per patient.  Requested copay card - given to patient at office visit today.

## 2019-09-30 NOTE — Patient Instructions (Signed)
1.  Start Emgality.  First dose involves 2 injections.  Then, 1 injection every 30 days thereafter. 2.  Use Ubrelvy for migraines.  If you should have another severe migraine that does not respond to the [place], contact our office and I will get you another rescue medication 3.  Follow up in 4 months

## 2019-10-08 ENCOUNTER — Other Ambulatory Visit: Payer: Self-pay

## 2019-10-09 ENCOUNTER — Ambulatory Visit (INDEPENDENT_AMBULATORY_CARE_PROVIDER_SITE_OTHER): Payer: 59 | Admitting: Women's Health

## 2019-10-09 ENCOUNTER — Encounter: Payer: Self-pay | Admitting: Women's Health

## 2019-10-09 VITALS — BP 120/74 | Ht 62.0 in | Wt 193.0 lb

## 2019-10-09 DIAGNOSIS — Z01419 Encounter for gynecological examination (general) (routine) without abnormal findings: Secondary | ICD-10-CM

## 2019-10-09 DIAGNOSIS — L749 Eccrine sweat disorder, unspecified: Secondary | ICD-10-CM

## 2019-10-09 MED ORDER — IBUPROFEN 600 MG PO TABS: 600.0000 mg | ORAL_TABLET | Freq: Three times a day (TID) | ORAL | 1 refills | Status: DC | PRN

## 2019-10-09 NOTE — Progress Notes (Signed)
Angelica Coleman 13-Jan-1991 [id number]    History:    Presents for new patient annual exam.  Regular monthly 5 to 6-day cycle/vasectomy.  History of migraines with aura. 2015 left breast fibroadenoma at 4 o'clock position stable on ultrasound.  Biometrics at work normal glucose, reports triglycerides slightly elevated and HDL slightly low.  States does not think she has had a Pap, Gardasil series completed.  Past medical history, past surgical history, family history and social history were all reviewed and documented in the EPIC chart.  Supervisor at [employer].  2 sons ages 27 and 54 doing well.  History of a ruptured ectopic 5 years ago.  Originally from [place], moved here for work.  ROS:  A ROS was performed and pertinent positives and negatives are included.  Exam:  Vitals:   10/09/19 1401  BP: 120/74  Weight: 193 lb (87.5 kg)  Height: 5\' 2"  (1.575 m)   Body mass index is 35.3 kg/m.   General appearance:  Normal Thyroid:  Symmetrical, normal in size, without palpable masses or nodularity. Respiratory  Auscultation:  Clear without wheezing or rhonchi Cardiovascular  Auscultation:  Regular rate, without rubs, murmurs or gallops  Edema/varicosities:  Not grossly evident Abdominal  Soft,nontender, without masses, guarding or rebound.  Liver/spleen:  No organomegaly noted  Hernia:  None appreciated  Skin  Inspection:  Grossly normal   Breasts: Examined lying and sitting.     Right: Without masses, retractions, discharge or axillary adenopathy.     Left: History of a fibroadenoma not palpable, without masses, retractions, discharge or axillary adenopathy. Gentitourinary   Inguinal/mons:  Normal without inguinal adenopathy  External genitalia:  Normal  BUS/Urethra/Skene's glands:  Normal  Vagina:  Normal menses  Cervix:  Normal  Uterus:   normal in size, shape and contour.  Midline and mobile  Adnexa/parametria:     Rt: Without masses or tenderness.   Lt: Without  masses or tenderness.  Anus and perineum: Normal    Assessment/Plan:  28 y.o. MWF G3, P2 for new patient annual exam.   Monthly 5-day cycle with dysmenorrhea/vasectomy Obesity Migraines with aura-neurologist managing 2015 fibroadenoma at 4 o'clock position on left breast/not palpable  Plan: Options for dysmenorrhea reviewed we will try Motrin 600 every 8 hours as needed, prescription given.  SBE's, exercise, decrease calories/carbs encouraged.  Vitamin D 1000 daily encouraged.  Weight watchers encouraged.  Reviewed importance of less than 20 g saturated fat daily, and cardio type exercise 30 minutes most days of the week will help to increase HDL and decrease overall cholesterol.  Recently has lost 7 pounds congratulated. TSH, Pap.    Huel Cote [facility], 2:11 PM 10/09/2019

## 2019-10-09 NOTE — Patient Instructions (Addendum)
It was nice to meet you  Health Maintenance, Female Adopting a healthy lifestyle and getting preventive care are important in promoting health and wellness. Ask your health care provider about:  The right schedule for you to have regular tests and exams.  Things you can do on your own to prevent diseases and keep yourself healthy. What should I know about diet, weight, and exercise? Eat a healthy diet   Eat a diet that includes plenty of vegetables, fruits, low-fat dairy products, and lean protein.  Do not eat a lot of foods that are high in solid fats, added sugars, or sodium. Maintain a healthy weight Body mass index (BMI) is used to identify weight problems. It estimates body fat based on height and weight. Your health care provider can help determine your BMI and help you achieve or maintain a healthy weight. Get regular exercise Get regular exercise. This is one of the most important things you can do for your health. Most adults should:  Exercise for at least 150 minutes each week. The exercise should increase your heart rate and make you sweat (moderate-intensity exercise).  Do strengthening exercises at least twice a week. This is in addition to the moderate-intensity exercise.  Spend less time sitting. Even light physical activity can be beneficial. Watch cholesterol and blood lipids Have your blood tested for lipids and cholesterol at 26 years of age, then have this test every 5 years. Have your cholesterol levels checked more often if:  Your lipid or cholesterol levels are high.  You are older than 26 years of age.  You are at high risk for heart disease. What should I know about cancer screening? Depending on your health history and family history, you may need to have cancer screening at various ages. This may include screening for:  Breast cancer.  Cervical cancer.  Colorectal cancer.  Skin cancer.  Lung cancer. What should I know about heart disease,  diabetes, and high blood pressure? Blood pressure and heart disease  High blood pressure causes heart disease and increases the risk of stroke. This is more likely to develop in people who have high blood pressure readings, are of African descent, or are overweight.  Have your blood pressure checked: ? Every 3-5 years if you are 26-50 years of age. ? Every year if you are 26 years old or older. Diabetes Have regular diabetes screenings. This checks your fasting blood sugar level. Have the screening done:  Once every three years after age 15 if you are at a normal weight and have a low risk for diabetes.  More often and at a younger age if you are overweight or have a high risk for diabetes. What should I know about preventing infection? Hepatitis B If you have a higher risk for hepatitis B, you should be screened for this virus. Talk with your health care provider to find out if you are at risk for hepatitis B infection. Hepatitis C Testing is recommended for:  Everyone born from [birth year] through 1963.  Anyone with known risk factors for hepatitis C. Sexually transmitted infections (STIs)  Get screened for STIs, including gonorrhea and chlamydia, if: ? You are sexually active and are younger than 26 years of age. ? You are older than 26 years of age and your health care provider tells you that you are at risk for this type of infection. ? Your sexual activity has changed since you were last screened, and you are at increased risk for chlamydia or  gonorrhea. Ask your health care provider if you are at risk.  Ask your health care provider about whether you are at high risk for HIV. Your health care provider may recommend a prescription medicine to help prevent HIV infection. If you choose to take medicine to prevent HIV, you should first get tested for HIV. You should then be tested every 3 months for as long as you are taking the medicine. Pregnancy  If you are about to stop having your  period (premenopausal) and you may become pregnant, seek counseling before you get pregnant.  Take 400 to 800 micrograms (mcg) of folic acid every day if you become pregnant.  Ask for birth control (contraception) if you want to prevent pregnancy. Osteoporosis and menopause Osteoporosis is a disease in which the bones lose minerals and strength with aging. This can result in bone fractures. If you are 26 years old or older, or if you are at risk for osteoporosis and fractures, ask your health care provider if you should:  Be screened for bone loss.  Take a calcium or vitamin D supplement to lower your risk of fractures.  Be given hormone replacement therapy (HRT) to treat symptoms of menopause. Follow these instructions at home: Lifestyle  Do not use any products that contain nicotine or tobacco, such as cigarettes, e-cigarettes, and chewing tobacco. If you need help quitting, ask your health care provider.  Do not use street drugs.  Do not share needles.  Ask your health care provider for help if you need support or information about quitting drugs. Alcohol use  Do not drink alcohol if: ? Your health care provider tells you not to drink. ? You are pregnant, may be pregnant, or are planning to become pregnant.  If you drink alcohol: ? Limit how much you use to 0-1 drink a day. ? Limit intake if you are breastfeeding.  Be aware of how much alcohol is in your drink. In the [place] one drink equals one 12 oz bottle of beer (355 mL), one 5 oz glass of wine (148 mL), or one 1 oz glass of hard liquor (44 mL). General instructions  Schedule regular health, dental, and eye exams.  Stay current with your vaccines.  Tell your health care provider if: ? You often feel depressed. ? You have ever been abused or do not feel safe at home. Summary  Adopting a healthy lifestyle and getting preventive care are important in promoting health and wellness.  Follow your health care provider's  instructions about healthy diet, exercising, and getting tested or screened for diseases.  Follow your health care provider's instructions on monitoring your cholesterol and blood pressure. This information is not intended to replace advice given to you by your health care provider. Make sure you discuss any questions you have with your health care provider. Document Released: 06/27/2011 Document Revised: 12/05/2018 Document Reviewed: 12/05/2018 Elsevier Patient Education  2020 [place] for Diabetes Mellitus, Adult  Carbohydrate counting is a method of keeping track of how many carbohydrates you eat. Eating carbohydrates naturally increases the amount of sugar (glucose) in the blood. Counting how many carbohydrates you eat helps keep your blood glucose within normal limits, which helps you manage your diabetes (diabetes mellitus). It is important to know how many carbohydrates you can safely have in each meal. This is different for every person. A diet and nutrition specialist (registered dietitian) can help you make a meal plan and calculate how many carbohydrates you should have  at each meal and snack. Carbohydrates are found in the following foods:  Grains, such as breads and cereals.  Dried beans and soy products.  Starchy vegetables, such as potatoes, peas, and corn.  Fruit and fruit juices.  Milk and yogurt.  Sweets and snack foods, such as cake, cookies, candy, chips, and soft drinks. How do I count carbohydrates? There are two ways to count carbohydrates in food. You can use either of the methods or a combination of both. Reading "Nutrition Facts" on packaged food The "Nutrition Facts" list is included on the labels of almost all packaged foods and beverages in the [place] It includes:  The serving size.  Information about nutrients in each serving, including the grams (g) of carbohydrate per serving. To use the "Nutrition Facts":  Decide how many  servings you will have.  Multiply the number of servings by the number of carbohydrates per serving.  The resulting number is the total amount of carbohydrates that you will be having. Learning standard serving sizes of other foods When you eat carbohydrate foods that are not packaged or do not include "Nutrition Facts" on the label, you need to measure the servings in order to count the amount of carbohydrates:  Measure the foods that you will eat with a food scale or measuring cup, if needed.  Decide how many standard-size servings you will eat.  Multiply the number of servings by 15. Most carbohydrate-rich foods have about 15 g of carbohydrates per serving. ? For example, if you eat 8 oz (170 g) of strawberries, you will have eaten 2 servings and 30 g of carbohydrates (2 servings x 15 g = 30 g).  For foods that have more than one food mixed, such as soups and casseroles, you must count the carbohydrates in each food that is included. The following list contains standard serving sizes of common carbohydrate-rich foods. Each of these servings has about 15 g of carbohydrates:   hamburger bun or  English muffin.   oz (15 mL) syrup.   oz (14 g) jelly.  1 slice of bread.  1 six-inch tortilla.  3 oz (85 g) cooked rice or pasta.  4 oz (113 g) cooked dried beans.  4 oz (113 g) starchy vegetable, such as peas, corn, or potatoes.  4 oz (113 g) hot cereal.  4 oz (113 g) mashed potatoes or  of a large baked potato.  4 oz (113 g) canned or frozen fruit.  4 oz (120 mL) fruit juice.  4-6 crackers.  6 chicken nuggets.  6 oz (170 g) unsweetened dry cereal.  6 oz (170 g) plain fat-free yogurt or yogurt sweetened with artificial sweeteners.  8 oz (240 mL) milk.  8 oz (170 g) fresh fruit or one small piece of fruit.  24 oz (680 g) popped popcorn. Example of carbohydrate counting Sample meal  3 oz (85 g) chicken breast.  6 oz (170 g) brown rice.  4 oz (113 g) corn.   8 oz (240 mL) milk.  8 oz (170 g) strawberries with sugar-free whipped topping. Carbohydrate calculation 1. Identify the foods that contain carbohydrates: ? Rice. ? Corn. ? Milk. ? Strawberries. 2. Calculate how many servings you have of each food: ? 2 servings rice. ? 1 serving corn. ? 1 serving milk. ? 1 serving strawberries. 3. Multiply each number of servings by 15 g: ? 2 servings rice x 15 g = 30 g. ? 1 serving corn x 15 g = 15 g. ?  1 serving milk x 15 g = 15 g. ? 1 serving strawberries x 15 g = 15 g. 4. Add together all of the amounts to find the total grams of carbohydrates eaten: ? 30 g + 15 g + 15 g + 15 g = 75 g of carbohydrates total. Summary  Carbohydrate counting is a method of keeping track of how many carbohydrates you eat.  Eating carbohydrates naturally increases the amount of sugar (glucose) in the blood.  Counting how many carbohydrates you eat helps keep your blood glucose within normal limits, which helps you manage your diabetes.  A diet and nutrition specialist (registered dietitian) can help you make a meal plan and calculate how many carbohydrates you should have at each meal and snack. This information is not intended to replace advice given to you by your health care provider. Make sure you discuss any questions you have with your health care provider. Document Released: 12/12/2005 Document Revised: 07/06/2017 Document Reviewed: 05/25/2016 Elsevier Patient Education  2020 [employer].

## 2019-10-10 LAB — TSH: TSH: 1.99 mIU/L

## 2019-10-15 LAB — PAP IG W/ RFLX HPV ASCU

## 2019-10-15 LAB — HUMAN PAPILLOMAVIRUS, HIGH RISK: HPV DNA High Risk: NOT DETECTED

## 2019-10-18 ENCOUNTER — Other Ambulatory Visit: Payer: Self-pay

## 2019-10-18 DIAGNOSIS — Z20822 Contact with and (suspected) exposure to covid-19: Secondary | ICD-10-CM

## 2019-10-19 LAB — NOVEL CORONAVIRUS, NAA: SARS-CoV-2, NAA: NOT DETECTED

## 2019-10-22 ENCOUNTER — Emergency Department (HOSPITAL_COMMUNITY): Payer: 59

## 2019-10-22 ENCOUNTER — Other Ambulatory Visit: Payer: Self-pay

## 2019-10-22 ENCOUNTER — Emergency Department (HOSPITAL_COMMUNITY)
Admission: EM | Admit: 2019-10-22 | Discharge: 2019-10-22 | Disposition: A | Discharge status: Home / Self Care | Payer: 59 | Attending: Emergency Medicine | Admitting: Emergency Medicine

## 2019-10-22 ENCOUNTER — Encounter (HOSPITAL_COMMUNITY): Payer: Self-pay | Admitting: Emergency Medicine

## 2019-10-22 ENCOUNTER — Telehealth: Payer: Self-pay | Admitting: *Deleted

## 2019-10-22 DIAGNOSIS — Z79899 Other long term (current) drug therapy: Secondary | ICD-10-CM | POA: Insufficient documentation

## 2019-10-22 DIAGNOSIS — R0602 Shortness of breath: Secondary | ICD-10-CM | POA: Diagnosis not present

## 2019-10-22 DIAGNOSIS — R0789 Other chest pain: Secondary | ICD-10-CM | POA: Diagnosis not present

## 2019-10-22 LAB — CBC WITH DIFFERENTIAL/PLATELET
Abs Immature Granulocytes: 0.02 10*3/uL (ref 0.00–0.07)
Basophils Absolute: 0 10*3/uL (ref 0.0–0.1)
Basophils Relative: 0 %
Eosinophils Absolute: 0 10*3/uL (ref 0.0–0.5)
Eosinophils Relative: 0 %
HCT: 44.2 % (ref 36.0–46.0)
Hemoglobin: 14.6 g/dL (ref 12.0–15.0)
Immature Granulocytes: 0 %
Lymphocytes Relative: 48 %
Lymphs Abs: 4.3 10*3/uL — ABNORMAL HIGH (ref 0.7–4.0)
MCH: 29.4 pg (ref 26.0–34.0)
MCHC: 33 g/dL (ref 30.0–36.0)
MCV: 89.1 fL (ref 80.0–100.0)
Monocytes Absolute: 0.5 10*3/uL (ref 0.1–1.0)
Monocytes Relative: 6 %
Neutro Abs: 4.1 10*3/uL (ref 1.7–7.7)
Neutrophils Relative %: 46 %
Platelets: 258 10*3/uL (ref 150–400)
RBC: 4.96 MIL/uL (ref 3.87–5.11)
RDW: 12.7 % (ref 11.5–15.5)
WBC: 8.9 10*3/uL (ref 4.0–10.5)
nRBC: 0 % (ref 0.0–0.2)

## 2019-10-22 LAB — COMPREHENSIVE METABOLIC PANEL
ALT: 23 U/L (ref 0–44)
AST: 14 U/L — ABNORMAL LOW (ref 15–41)
Albumin: 4.4 g/dL (ref 3.5–5.0)
Alkaline Phosphatase: 59 U/L (ref 38–126)
Anion gap: 10 (ref 5–15)
BUN: 18 mg/dL (ref 6–20)
CO2: 26 mmol/L (ref 22–32)
Calcium: 9.3 mg/dL (ref 8.9–10.3)
Chloride: 104 mmol/L (ref 98–111)
Creatinine, Ser: 0.77 mg/dL (ref 0.44–1.00)
GFR calc Af Amer: >60 mL/min (ref >60)
GFR calc non Af Amer: >60 mL/min (ref >60)
Glucose, Bld: 88 mg/dL (ref 70–99)
Potassium: 3.4 mmol/L — ABNORMAL LOW (ref 3.5–5.1)
Sodium: 140 mmol/L (ref 135–145)
Total Bilirubin: 0.8 mg/dL (ref 0.3–1.2)
Total Protein: 7.4 g/dL (ref 6.5–8.1)

## 2019-10-22 LAB — TROPONIN I (HIGH SENSITIVITY): Troponin I (High Sensitivity): <2 ng/L (ref <18)

## 2019-10-22 LAB — D-DIMER, QUANTITATIVE: D-Dimer, Quant: 0.33 ug/mL-FEU (ref 0.00–0.50)

## 2019-10-22 MED ORDER — IBUPROFEN 600 MG PO TABS: 600.0000 mg | ORAL_TABLET | Freq: Three times a day (TID) | ORAL | 1 refills | Status: AC | PRN

## 2019-10-22 NOTE — ED Provider Notes (Signed)
[facility]-EMERGENCY DEPT Provider Note   CSN: [id number] Arrival date & time: 10/22/19  1101     History   Chief Complaint Chief Complaint  Patient presents with  . Shortness of Breath    HPI Angelica Coleman is a 28 y.o. female.     28 y.o female with a PMH of Migraines, Chronic bronchitis sent to the ED with complaints of shortness of breath x4 days.  Patient reports her symptoms began on Friday, she somewhat thought she might have Covid as she was having difficulty breathing.  Reports she was Covid tested on Friday with the results of negative.  She reports she had a televisit Saturday, was given a prescription for albuterol along with prednisone to help with her symptoms.  She was advised to discontinue both that she felt that the combination therapy was making her symptoms worse.  She reports last night while lying flat she felt like she was somewhat "smother by my chest ".  She also endorses some chest pressure in the center of her chest reports is hard for her to take a deep breath.  Patient was evaluated in urgent care this morning, had an EKG along with a D-dimer which was sent off.  She reports while parking her car today she had severe shortness of breath with walking from her car onto the ED.  Patient does have a prior history of mitral regurgitation, had a last normal echo 2 years ago in [place].  She was seen by Dr. Jacinto HalimGanji who recommended no further work-up for this.  He denies any fever, swelling of her legs, prior history of CHF, prior history of CAD. Of note, patient thinks she is likely having a reaction to her headache medication Emgality, she does receive this injection for her headaches.  Last received this medication on October 5 approximately 22 days ago.  The history is provided by the patient and medical records.    Past Medical History:  Diagnosis Date  . Chronic bronchitis (HCC)    as a child  . Kidney stones   . Migraines     There are  no active problems to display for this patient.   Past Surgical History:  Procedure Laterality Date  . ECTOPIC PREGNANCY SURGERY    . kidney stone removal       OB History    Gravida  3   Para  2   Term  2   Preterm      AB  1   Living  2     SAB      TAB      Ectopic  1   Multiple      Live Births               Home Medications    Prior to Admission medications   Medication Sig Start Date End Date Taking? Authorizing Provider  Galcanezumab-gnlm (EMGALITY) 120 MG/ML SOAJ Inject 240 mg into the skin every 30 (thirty) days. Loading dose 09/30/19  Yes Jaffe, Adam R, DO  ondansetron (ZOFRAN ODT) 8 MG disintegrating tablet Take 1 tablet (8 mg total) by mouth every 8 (eight) hours as needed for nausea or vomiting. 07/12/18  Yes Donnetta Hutchingook, Brian, MD  Ubrogepant (UBRELVY) 100 MG TABS Take 100 mg by mouth every 2 (two) hours as needed. Max 200 mg 24 hrs Patient taking differently: Take 100 mg by mouth every 2 (two) hours as needed. Max 200 mg 24 hrs Pt using co-pay card  04/22/19  Yes Jaffe, Adam R, DO  albuterol (PROVENTIL) (2.5 MG/3ML) 0.083% nebulizer solution Inhale 2.5 mg into the lungs every 4 (four) hours as needed for wheezing or shortness of breath. 10/18/19   [provider]  Galcanezumab-gnlm (EMGALITY) 120 MG/ML SOAJ Inject 120 mg into the skin every 30 (thirty) days. 09/30/19   Everlena Cooper, Adam R, DO  ibuprofen (ADVIL) 600 MG tablet Take 1 tablet (600 mg total) by mouth every 8 (eight) hours as needed for up to 10 days. 10/22/19 11/01/19  Claude Manges, PA-C  VENTOLIN HFA 108 (90 Base) MCG/ACT inhaler Inhale 1-2 puffs into the lungs every 4 (four) hours as needed for wheezing or shortness of breath. 10/18/19   [provider]    Family History Family History  Problem Relation Age of Onset  . Hypertension Mother   . High Cholesterol Mother   . CVA Mother 56  . Hypertension Father   . High Cholesterol Father   . Diabetes Sister   . Hypertension Sister      Social History Social History   Tobacco Use  . Smoking status: Never Smoker  . Smokeless tobacco: Never Used  Substance Use Topics  . Alcohol use: Never    Frequency: Never  . Drug use: Never     Allergies   Percocet [oxycodone-acetaminophen], Penicillins, Aimovig [erenumab-aooe], and Emgality [galcanezumab-gnlm]   Review of Systems Review of Systems  Constitutional: Negative for chills and fever.  HENT: Negative for ear pain and sore throat.   Eyes: Negative for pain and visual disturbance.  Respiratory: Positive for shortness of breath. Negative for cough.   Cardiovascular: Positive for chest pain. Negative for palpitations.  Gastrointestinal: Negative for abdominal pain and vomiting.  Genitourinary: Negative for dysuria and hematuria.  Musculoskeletal: Negative for arthralgias and back pain.  Skin: Negative for color change and rash.  Neurological: Negative for dizziness, seizures, syncope, weakness, light-headedness and headaches.  All other systems reviewed and are negative.    Physical Exam Updated Vital Signs BP (!) 129/97 (BP Location: Left Arm)   Pulse (!) 57   Temp 98 F (36.7 C)   Resp 16   LMP 10/08/2019   SpO2 100%   Physical Exam Vitals signs and nursing note reviewed.  Constitutional:      General: She is not in acute distress.    Appearance: She is well-developed. She is not ill-appearing, toxic-appearing or diaphoretic.  HENT:     Head: Normocephalic and atraumatic.     Mouth/Throat:     Pharynx: No oropharyngeal exudate.  Eyes:     Pupils: Pupils are equal, round, and reactive to light.  Neck:     Musculoskeletal: Normal range of motion.  Cardiovascular:     Rate and Rhythm: Normal rate and regular rhythm.     Heart sounds: Normal heart sounds. No murmur.     Comments: No BL leg pitting edema.  Pulmonary:     Effort: Pulmonary effort is normal. No respiratory distress.     Breath sounds: Normal breath sounds. No decreased breath  sounds or wheezing.     Comments: No tachypnea. Lungs are clear to ascultation.  Abdominal:     General: Bowel sounds are normal. There is no distension.     Palpations: Abdomen is soft.     Tenderness: There is no abdominal tenderness.  Musculoskeletal:        General: No tenderness or deformity.     Right lower leg: No edema.     Left  lower leg: No edema.  Skin:    General: Skin is warm and dry.  Neurological:     Mental Status: She is alert and oriented to person, place, and time.      ED Treatments / Results  Labs (all labs ordered are listed, but only abnormal results are displayed) Labs Reviewed  CBC WITH DIFFERENTIAL/PLATELET - Abnormal; Notable for the following components:      Result Value   Lymphs Abs 4.3 (*)    All other components within normal limits  COMPREHENSIVE METABOLIC PANEL - Abnormal; Notable for the following components:   Potassium 3.4 (*)    AST 14 (*)    All other components within normal limits  D-DIMER, QUANTITATIVE (NOT AT [facility])  TROPONIN I (HIGH SENSITIVITY)  TROPONIN I (HIGH SENSITIVITY)    EKG EKG Interpretation  Date/Time:  Tuesday October 22 2019 11:38:16 EDT Ventricular Rate:  67 PR Interval:    QRS Duration: 106 QT Interval:  377 QTC Calculation: 398 R Axis:   59 Text Interpretation: Sinus arrhythmia No significant change since last tracing Confirmed by Marianna Fuss ([id number]) on 10/22/2019 5:28:09 PM   Radiology Dg Chest 2 View  Result Date: 10/22/2019 CLINICAL DATA:  Shortness of breath EXAM: CHEST - 2 VIEW COMPARISON:  07/11/2018 FINDINGS: Heart and mediastinal contours are within normal limits. No focal opacities or effusions. No acute bony abnormality. IMPRESSION: No active cardiopulmonary disease. Electronically Signed   By: Charlett Nose M.D.   On: 10/22/2019 12:49    Procedures Procedures (including critical care time)  Medications Ordered in ED Medications - No data to display   Initial Impression / Assessment  and Plan / ED Course  I have reviewed the triage vital signs and the nursing notes.  Pertinent labs & imaging results that were available during my care of the patient were reviewed by me and considered in my medical decision making (see chart for details).       Patient with a past medical history of mitral regurgitation presents to the ED with complaints of shortness of breath x3 days.  Patient was recently placed on steroid therapy to help with her headaches, reports she is unaware if this is causing an allergic reaction.  She reports she was recently given a shot of Emgality by her neurologist on October 5th.   Patient does have a prior history of Mitral regurgitation reports she had follow-up with Dr. Jacinto Halim who cleared her after a normal echo which was done in [place] 2 years ago.  She denies any prior history of CHF, CAD.  Today she endorses some pain along the center of her chest without any radiation.  Reports she is unable to take a deep breath.  She does not have any risk factors for pulmonary embolism such as prior hospitalization, estrogen use, tachycardia on exam.  Will obtain D-dimer.  Low suspicion for ACS as pain seems somewhat atypical.  EKG appears unchanged from previous without any ST elevations or changes consistent with STEMI.  CMP without any electrolyte derangement aside from some mild hypokalemia.  Creatinine level is within normal limits.  LFTs are within normal limits.  CBC without any leukocytosis.  First troponin was negative. X-ray of her chest was ordered which showed no consolidation, pneumothorax, pleural effusion.  Patient's oxygenation level has stayed at 100% on room air during her 6-hour visit to the ED.  There is no tachycardia, no tachypnea.  She appears comfortable.  During decision-making with patient, she is agreeable on  obtaining follow-up via PCP.  Some suspicion for pleurisy, although she has had no fever, chills or is toxic appearing.  Patient is  agreeable to follow-up with low-power PCP.  With otherwise stable vital signs.  Patient stable for discharge.    Portions of this note were generated with [employer]. Dictation errors may occur despite best attempts at proofreading.  Final Clinical Impressions(s) / ED Diagnoses   Final diagnoses:  Shortness of breath    ED Discharge Orders         Ordered    ibuprofen (ADVIL) 600 MG tablet  Every 8 hours PRN     10/22/19 1743           Janeece Fitting, PA-C 10/22/19 1744    Lucrezia Starch, MD 10/23/19 1013

## 2019-10-22 NOTE — Discharge Instructions (Signed)
Your laboratory results were within normal limits today.  Your chest x-ray was clear.  I have prescribed prescription for some ibuprofen 600 mg to help with your pain, please take this every 8 hours to help with your symptoms.   I have provided a referral to [facility], please call to schedule an appointment to establish primary care.  If you experience any fever, worsening symptoms please return to the emergency department.

## 2019-10-22 NOTE — Telephone Encounter (Addendum)
Called Alaura back and she stated she is not better/worse still taking a lot of effort to take a breath. Still feels "tight". She does not have a primary care MD - usually just goes to the [facility] urgent care where she was just seen. She is heading to [facility] ER now to be evaluated.  Instructed patient to please call our office back once she has been seen/feels better so she and Dr. Tomi Likens can decide on headache management/make next appt. Patient was appreciative and verbalized understanding.

## 2019-10-22 NOTE — Telephone Encounter (Signed)
Patient calling from [facility]gent care where she was just released. She took first dose of Emgality on 10/5. Last week 10/19-23 she felt fatigued and had headache. Friday started SOB and had covid test. Was negative. Was given 3 days of PO prednisone finished Monday.  She felt it was so hard to breathe last night was going to call the ambulance. Went to urgent care this am - EKG and CXR normal. She said they drew blood to check for blood clot (no scans ordered).   Respirations and HR normal. Still feels "tight" and hard to get a good breath. Temp 99.  Urgent care told her to call her neurologist in case it was reaction to Emaglity (took 10/5). She feels uneasy leaving as since takes so much effort to take a breath. She wanted to know what to do.  She is staying in parking lot of Urgent Care until hears back from [place] and stated she will go back in the doors if breathing gets worse. Informed her will let MD know and call her back.

## 2019-10-22 NOTE — ED Triage Notes (Signed)
Pt reports that last week felt weak, headache started steroids. Pt reports SOB for past couple days. EKG and chest xray were good at [facility] earlier bt neurologist advised to go to tEd since SOB worse with exertion.

## 2019-10-22 NOTE — Telephone Encounter (Signed)
This is not a common side effect of Emgality but I would no longer continue to use it.  As a neurologist, there is nothing I can do to treat her symptoms as it is outside my scope of practice.  If she is still having dyspnea, she really needs to continue being evaluated and treated by internal medicine.  If she is having increased difficulty breathing, she needs to go to the ED.  After this is all figured out, I would like her to make a follow up appointment to discuss management of her headaches.

## 2019-10-29 ENCOUNTER — Telehealth: Payer: Self-pay | Admitting: Neurology

## 2019-10-29 NOTE — Telephone Encounter (Signed)
Telephone note on 10/27 was reviewed.  She called with the same question to Dr. Tomi Likens on that date.  He recommended stopping Emgality.  His note indicated that once her shortness of breath has improved, return for office visit to discuss options with him.

## 2019-10-29 NOTE — Telephone Encounter (Signed)
Called patient no answer left provider response on voice mail. Pt to call office back to make appt with Dr. Tomi Likens to discuss options

## 2019-10-29 NOTE — Telephone Encounter (Signed)
Noted! Thank you

## 2019-10-29 NOTE — Telephone Encounter (Signed)
Virtual visit 11/04/19 at 1:50 PM with Dr. Tomi Likens.

## 2019-10-29 NOTE — Telephone Encounter (Signed)
Called patient Angelica Coleman states Angelica Coleman was seen in [facility] on 10/22/19 for SOB Angelica Coleman thought her symptoms was coming from her Emgality injection. They were unable to r/o if Angelica Coleman was having a reaction to her Emgality. Angelica Coleman would like to know if Dr. Tomi Likens wanted her to continue with this medication or try something else. Angelica Coleman did not want to go without something for her headaches. Angelica Coleman has follow up with PCP on Friday.  Patient aware provider out of the office until Thursday but will send to be address by another provider in his absence.

## 2019-10-29 NOTE — Telephone Encounter (Signed)
Patient called requesting to speak with the nurse about restarting Emgality. She said she is having a migraine coming on but needs to speak with the nurse before taking it. She previously was seen in the ED and treated for pleurisy but said she initially thought it was an allergic reaction to [facility].  [facility] [address], [place]

## 2019-11-01 ENCOUNTER — Ambulatory Visit (INDEPENDENT_AMBULATORY_CARE_PROVIDER_SITE_OTHER): Payer: 59 | Admitting: Family

## 2019-11-01 ENCOUNTER — Other Ambulatory Visit (INDEPENDENT_AMBULATORY_CARE_PROVIDER_SITE_OTHER): Payer: 59

## 2019-11-01 ENCOUNTER — Encounter: Payer: Self-pay | Admitting: Family

## 2019-11-01 ENCOUNTER — Other Ambulatory Visit: Payer: Self-pay

## 2019-11-01 VITALS — BP 126/72 | HR 98 | Temp 98.3°F | Ht 62.0 in | Wt 194.1 lb

## 2019-11-01 DIAGNOSIS — IMO0002 Reserved for concepts with insufficient information to code with codable children: Secondary | ICD-10-CM

## 2019-11-01 DIAGNOSIS — Z23 Encounter for immunization: Secondary | ICD-10-CM

## 2019-11-01 DIAGNOSIS — R109 Unspecified abdominal pain: Secondary | ICD-10-CM

## 2019-11-01 DIAGNOSIS — L219 Seborrheic dermatitis, unspecified: Secondary | ICD-10-CM

## 2019-11-01 DIAGNOSIS — G43709 Chronic migraine without aura, not intractable, without status migrainosus: Secondary | ICD-10-CM

## 2019-11-01 LAB — URINALYSIS, ROUTINE W REFLEX MICROSCOPIC
Bilirubin Urine: NEGATIVE
Hgb urine dipstick: NEGATIVE
Nitrite: NEGATIVE
Specific Gravity, Urine: 1.025 (ref 1.000–1.030)
Total Protein, Urine: NEGATIVE
Urine Glucose: NEGATIVE
Urobilinogen, UA: 0.2 (ref 0.0–1.0)
pH: 6 (ref 5.0–8.0)

## 2019-11-01 LAB — COMPREHENSIVE METABOLIC PANEL
ALT: 18 U/L (ref 0–35)
AST: 11 U/L (ref 0–37)
Albumin: 4.4 g/dL (ref 3.5–5.2)
Alkaline Phosphatase: 69 U/L (ref 39–117)
BUN: 12 mg/dL (ref 6–23)
CO2: 24 mEq/L (ref 19–32)
Calcium: 9.4 mg/dL (ref 8.4–10.5)
Chloride: 107 mEq/L (ref 96–112)
Creatinine, Ser: 0.7 mg/dL (ref 0.40–1.20)
GFR: 100.97 mL/min (ref >60.00)
Glucose, Bld: 105 mg/dL — ABNORMAL HIGH (ref 70–99)
Potassium: 4 mEq/L (ref 3.5–5.1)
Sodium: 140 mEq/L (ref 135–145)
Total Bilirubin: 0.6 mg/dL (ref 0.2–1.2)
Total Protein: 7.1 g/dL (ref 6.0–8.3)

## 2019-11-01 LAB — CBC WITH DIFFERENTIAL/PLATELET
Basophils Absolute: 0 10*3/uL (ref 0.0–0.1)
Basophils Relative: 0.4 % (ref 0.0–3.0)
Eosinophils Absolute: 0.1 10*3/uL (ref 0.0–0.7)
Eosinophils Relative: 0.7 % (ref 0.0–5.0)
HCT: 42.3 % (ref 36.0–46.0)
Hemoglobin: 14.7 g/dL (ref 12.0–15.0)
Lymphocytes Relative: 32.1 % (ref 12.0–46.0)
Lymphs Abs: 2.4 10*3/uL (ref 0.7–4.0)
MCHC: 34.7 g/dL (ref 30.0–36.0)
MCV: 86.7 fl (ref 78.0–100.0)
Monocytes Absolute: 0.4 10*3/uL (ref 0.1–1.0)
Monocytes Relative: 5.8 % (ref 3.0–12.0)
Neutro Abs: 4.6 10*3/uL (ref 1.4–7.7)
Neutrophils Relative %: 61 % (ref 43.0–77.0)
Platelets: 227 10*3/uL (ref 150.0–400.0)
RBC: 4.88 Mil/uL (ref 3.87–5.11)
RDW: 13 % (ref 11.5–15.5)
WBC: 7.6 10*3/uL (ref 4.0–10.5)

## 2019-11-01 MED ORDER — KETOCONAZOLE 2 % EX SHAM: 1.0000 "application " | MEDICATED_SHAMPOO | CUTANEOUS | 1 refills | Status: DC

## 2019-11-01 NOTE — Progress Notes (Signed)
Angelica Coleman is a 28 y.o. female with the following history as recorded in EpicCare:  There are no active problems to display for this patient.   Current Outpatient Medications  Medication Sig Dispense Refill  . albuterol (PROVENTIL) (2.5 MG/3ML) 0.083% nebulizer solution Inhale 2.5 mg into the lungs every 4 (four) hours as needed for wheezing or shortness of breath.    . Galcanezumab-gnlm (EMGALITY) 120 MG/ML SOAJ Inject 240 mg into the skin every 30 (thirty) days. Loading dose 2 pen 0  . Galcanezumab-gnlm (EMGALITY) 120 MG/ML SOAJ Inject 120 mg into the skin every 30 (thirty) days. 1 pen 11  . ibuprofen (ADVIL) 600 MG tablet Take 1 tablet (600 mg total) by mouth every 8 (eight) hours as needed for up to 10 days. 15 tablet 1  . ondansetron (ZOFRAN ODT) 8 MG disintegrating tablet Take 1 tablet (8 mg total) by mouth every 8 (eight) hours as needed for nausea or vomiting. 15 tablet 0  . Ubrogepant (UBRELVY) 100 MG TABS Take 100 mg by mouth every 2 (two) hours as needed. Max 200 mg 24 hrs (Patient taking differently: Take 100 mg by mouth every 2 (two) hours as needed. Max 200 mg 24 hrs Pt using co-pay card) 9 tablet 3  . VENTOLIN HFA 108 (90 Base) MCG/ACT inhaler Inhale 1-2 puffs into the lungs every 4 (four) hours as needed for wheezing or shortness of breath.    Derrill Memo ON 11/04/2019] ketoconazole (NIZORAL) 2 % shampoo Apply 1 application topically 2 (two) times a week. 120 mL 1   No current facility-administered medications for this visit.     Allergies: Percocet [oxycodone-acetaminophen], Penicillins, Aimovig [erenumab-aooe], and Emgality [galcanezumab-gnlm]  Past Medical History:  Diagnosis Date  . Chronic bronchitis ([place])    as a child  . Kidney stones   . Migraines     Past Surgical History:  Procedure Laterality Date  . ECTOPIC PREGNANCY SURGERY    . kidney stone removal      Family History  Problem Relation Age of Onset  . Hypertension Mother   . High Cholesterol Mother   .  CVA Mother 60  . Hypertension Father   . High Cholesterol Father   . Diabetes Sister   . Hypertension Sister     Social History   Tobacco Use  . Smoking status: Never Smoker  . Smokeless tobacco: Never Used  Substance Use Topics  . Alcohol use: Never    Frequency: Never    Subjective:  Patient presents today as a new patient; was seen at the ER last week with SOB- thought to be pleurisy; had normal D-dimer, EKG; CXR was actually normal as well- no pleurisy noted; concern was raised for allergic reaction to [facility]; notes that she is feeling much better- 90% improved; does mention that this past week has been having increased abdominal pain, RUQ pain/ tenderness; FH is positive for gallbladder disease- mother and sister both had theirs removed before 73; Has also been having increased "facial breaking out" recently- questioning reaction to [place] or Emgality; notes however, rash is localized to her cheeks/ scalp; has actually had these type of problems on and off in the past- has seen dermatology but topical oil provided offered no benefit;   LMP- husband has had vasectomy   Objective:  Vitals:   11/01/19 0944  BP: 126/72  Pulse: 98  Temp: 98.3 F (36.8 C)  TempSrc: Oral  SpO2: 98%  Weight: 194 lb 1.3 oz (88 kg)  Height: 5'  2" (1.575 m)    General: Well developed, well nourished, in no acute distress  Skin : Warm and dry. Erythema noted over bilateral cheeks Head: Normocephalic and atraumatic  Eyes: Sclera and conjunctiva clear; pupils round and reactive to light; extraocular movements intact  Ears: External normal; canals clear; tympanic membranes normal  Oropharynx: Pink, supple. No suspicious lesions  Neck: Supple without thyromegaly, adenopathy  Lungs: Respirations unlabored; clear to auscultation bilaterally without wheeze, rales, rhonchi  CVS exam: normal rate and regular rhythm.  Abdomen: Soft; mildly tender over RUQ; nondistended; normoactive bowel sounds; no  masses or hepatosplenomegaly  Musculoskeletal: No deformities; no active joint inflammation  Extremities: No edema, cyanosis, clubbing  Vessels: Symmetric bilaterally  Neurologic: Alert and oriented; speech intact; face symmetrical; moves all extremities well; CNII-XII intact without focal deficit   Assessment:  1. Abdominal pain, unspecified abdominal location   2. Seborrheic dermatitis   3. Chronic migraine     Plan:  1. ? If SOB symptoms patient has been experiencing could actually be atypical presentation for gallbladder attacks; recent work-up at ER was reassuring- normal CXR, EKG and D-dimer; updated abdominal ultrasound, CBC, CMP and urine culture today; follow-up to be determined. 2. Trial of Nizoral shampoo- if symptoms persist, refer to dermatology/ ? Psoriasis on scalp if no response. 3. Continue with neurologist- have also reached out to neurologist so he is aware of information from visit today as concerns for medication reaction being considered.   No follow-ups on file.  Orders Placed This Encounter  Procedures  . Urine Culture    Standing Status:   Future    Number of Occurrences:   1    Standing Expiration Date:   10/31/2020  . US Abdomen Complete    Standing Status:   Future    Standing Expiration Date:   12/31/2020    Order Specific Question:   Reason for Exam (SYMPTOM  OR DIAGNOSIS REQUIRED)    Answer:   abdominal pain    Order Specific Question:   Preferred imaging location?    Answer:   [facility]  . CBC w/Diff    Standing Status:   Future    Number of Occurrences:   1    Standing Expiration Date:   10/31/2020  . Comp Met (CMET)    Standing Status:   Future    Number of Occurrences:   1    Standing Expiration Date:   10/31/2020  . Urinalysis    Standing Status:   Future    Number of Occurrences:   1    Standing Expiration Date:   10/31/2020    Requested Prescriptions   Signed Prescriptions Disp Refills  . ketoconazole (NIZORAL) 2 % shampoo  120 mL 1    Sig: Apply 1 application topically 2 (two) times a week.

## 2019-11-01 NOTE — Addendum Note (Signed)
Addended by: Marcina Millard on: 11/01/2019 01:06 PM   Modules accepted: Orders

## 2019-11-01 NOTE — Patient Instructions (Signed)
Seborrheic Dermatitis, Adult Seborrheic dermatitis is a skin disease that causes red, scaly patches. It usually occurs on the scalp, and it is often called dandruff. The patches may appear on other parts of the body. Skin patches tend to appear where there are many oil glands in the skin. Areas of the body that are commonly affected include:  Scalp.  Skin folds of the body.  Ears.  Eyebrows.  Neck.  Face.  Armpits.  The bearded area of men's faces. The condition may come and go for no known reason, and it is often long-lasting (chronic). What are the causes? The cause of this condition is not known. What increases the risk? This condition is more likely to develop in people who:  Have certain conditions, such as: ? HIV (human immunodeficiency virus). ? AIDS (acquired immunodeficiency syndrome). ? Parkinson disease. ? Mood disorders, such as depression.  Are 40-60 years old. What are the signs or symptoms? Symptoms of this condition include:  Thick scales on the scalp.  Redness on the face or in the armpits.  Skin that is flaky. The flakes may be white or yellow.  Skin that seems oily or dry but is not helped with moisturizers.  Itching or burning in the affected areas. How is this diagnosed? This condition is diagnosed with a medical history and physical exam. A sample of your skin may be tested (skin biopsy). You may need to see a skin specialist (dermatologist). How is this treated? There is no cure for this condition, but treatment can help to manage the symptoms. You may get treatment to remove scales, lower the risk of skin infection, and reduce swelling or itching. Treatment may include:  Creams that reduce swelling and irritation (steroids).  Creams that reduce skin yeast.  Medicated shampoo, soaps, moisturizing creams, or ointments.  Medicated moisturizing creams or ointments. Follow these instructions at home:  Apply over-the-counter and prescription  medicines only as told by your health care provider.  Use any medicated shampoo, soaps, skin creams, or ointments only as told by your health care provider.  Keep all follow-up visits as told by your health care provider. This is important. Contact a health care provider if:  Your symptoms do not improve with treatment.  Your symptoms get worse.  You have new symptoms. This information is not intended to replace advice given to you by your health care provider. Make sure you discuss any questions you have with your health care provider. Document Released: 12/12/2005 Document Revised: 11/24/2017 Document Reviewed: 03/31/2016 Elsevier Patient Education  2020 Elsevier Inc.  

## 2019-11-03 LAB — URINE CULTURE
MICRO NUMBER:: 1073120
SPECIMEN QUALITY:: ADEQUATE

## 2019-11-03 NOTE — Progress Notes (Signed)
Virtual Visit via Video Note The purpose of this virtual visit is to provide medical care while limiting exposure to the novel coronavirus.    Consent was obtained for video visit:  Yes Answered questions that patient had about telehealth interaction:  Yes I discussed the limitations, risks, security and privacy concerns of performing an evaluation and management service by telemedicine. I also discussed with the patient that there may be a patient responsible charge related to this service. The patient expressed understanding and agreed to proceed.  Pt location: Home Physician Location: office Name of referring provider:  Lindaann Pascal, PA-C I connected with Angelica Coleman at patients initiation/request on 11/04/2019 at  1:50 PM EST by video enabled telemedicine application and verified that I am speaking with the correct person using two identifiers. Pt MRN:  [id number] Pt DOB:  07/29/1991 Video Participants:  Angelica Coleman    History of Present Illness:  Angelica Coleman is a 28 year old left-handed womanwithmitral regurgitation, palpitations and history of recurrent kidney stones who follows up for migraine with aura.  UPDATE: Last month, she was started on Emgality.  She took her first dose on 10/5.  On 10/23, she developed shortness of breath.  Covid testing was negative.  D-dimer was negative.  She was concerned that it may have been a side effect of the Emgality, so it was decided to hold further doses.  She still reports mild shortness of breath with prolonged walking.  She followed up with her PCP who noted right upper quandrant tenderness.  Question if she is having gallbladder issues, as it runs in her family.  She also has a rash on her face, which was diagnosed as seborrheic dermatitis rather than drug reaction.  She actually has noted reduction in migraines on the Emgality.    Intensity:  Moderate  Duration:  Usually within an hour off and on.  Frequency:  5 days in a  month. Rescue protocol:  First line naproxen; Ubrelvy if severe. Current NSAID: Naproxen 550mg  Current analgesic:None Current triptan:None Current antihypertensive: None  Current antidepressant: None Current anti-CGRP: Ubrelvy 100mg  (abortive)  Caffeine: No Exercise: Trying to exercise Depression: No; Anxiety: Some Sleep hygiene: Good.  HISTORY: In 2016, while pregnant with her son, she developed numbness and tingling of her hands and nose associated with vision loss in both eyes described as only seeing the outline of whatever she looks at with blurred vision. Numbness lasts 20 minutes but visual disturbance lasts longer. It is followed by severe pounding headache on crown lasting into the next day. It is associated with nausea, photophobia but no unilateral weakness. It is aggravated by looking at computer screens or fluorescent lights. It is relieved by resting in dark room.  In September 2019, she got home from work and had another episode. She went to bed and when she woke up, she was fine. Later the next day, symptoms returned. She had associated fatigue. Symptoms lasted about 20 minutes and followed by the headache. She went to Urgent Care and was referred to neurology.  In January 2020,she woke up with excruciating headache. Sumatriptan effective after 2nd dose. She developed numbness in the right hand. The next day, she woke up and her right hand and forearm was still numb. She went to work and she had trouble using the keyboard on her computer due to difficulty with her fingers in the right hand. That night, it started hurting. Then on Wednesday, she was well. On Thursday, she developed sudden right sided  numbness face, arm, torso and leg, which lasted about 24 hours. She was having trouble with memory such as names and events. She had an MRI of brain with and without contrast performed on 01/08/19, which demonstrated 3 mm enhancing nodular focus in  the superficial left parietal region with small amount of adjacent parenchymal T2/FLAIR hyperintensity. Differential mentioned includes "questionable small subacute infarct with parenchymal enhancement versus possible mycotic aneurysm with adjacent inflammatory changes versus other." Imaging not available to personally review. She was started on ASA 325mg  daily by her PCP for possible stroke. She was feeling fatigued for the next couple of weeks. No fevers. She started feeling better last week. Every now and then she notes random bursts of scalp neuralgia (burning/needles) for 5 minutes. Due to findings on brain MRI from January, she underwent workup.CTA of head and neck from 01/29/19 personally reviewed showed mild cervical ICA tortuosity at the C2 level but otherwise no aneurysm or vascular occlusion or stenosis. She presented to the ED on 02/11/19 for right sided numbness and tingling and lightheadedness. MRI of brain with and without contrast was personally reviewed and demonstrated a single nonspecific hyperintense T2 focus in the left parietal white matter. She was treated with a headache cocktail. Repeat MRI of brain with and without contrast from 05/22/19 was personally reviewed and was unchanged.  She took ibuprofen 200mg  which was ineffective. 600mg  helped to at least go to sleep. She did not tolerate Excedrin.  Shepreviously tookToprol XL 25mg  for palpitations. Palpitations have been stronger lately and usually occur during her menstrual cycle.  Family history: Mother had stroke in her 25s. Shealso oncehad a migraine with stroke-like symptoms but no prior history of migraines. Her uncle also had a migraine with stroke-like symptoms as well.  Past NSAIDS: ibuprofen Past antihypertensive: Toprol XL (palpitations), propranolol ER 60mg  (palpitations) Past antidepressant: Nortriptyline (nightmares) Past anti-CGRP:  Aimovig 140mg  (effective, hives) Contraindicated  medications:  Triptans (stroke-like symptoms with migraine); topiramate (history of recurrent kidney stones)  Past Medical History: Past Medical History:  Diagnosis Date  . Chronic bronchitis ([place])    as a child  . Kidney stones   . Migraines     Medications: Outpatient Encounter Medications as of 11/04/2019  Medication Sig Note  . albuterol (PROVENTIL) (2.5 MG/3ML) 0.083% nebulizer solution Inhale 2.5 mg into the lungs every 4 (four) hours as needed for wheezing or shortness of breath.   . Galcanezumab-gnlm (EMGALITY) 120 MG/ML SOAJ Inject 240 mg into the skin every 30 (thirty) days. Loading dose   . Galcanezumab-gnlm (EMGALITY) 120 MG/ML SOAJ Inject 120 mg into the skin every 30 (thirty) days. 10/22/2019: Normal dose! Took double dose for first dose.   Derrill Memo ON 11/04/2019] ketoconazole (NIZORAL) 2 % shampoo Apply 1 application topically 2 (two) times a week.   . ondansetron (ZOFRAN ODT) 8 MG disintegrating tablet Take 1 tablet (8 mg total) by mouth every 8 (eight) hours as needed for nausea or vomiting.   Marland Kitchen Ubrogepant (UBRELVY) 100 MG TABS Take 100 mg by mouth every 2 (two) hours as needed. Max 200 mg 24 hrs (Patient taking differently: Take 100 mg by mouth every 2 (two) hours as needed. Max 200 mg 24 hrs Pt using co-pay card)   . VENTOLIN HFA 108 (90 Base) MCG/ACT inhaler Inhale 1-2 puffs into the lungs every 4 (four) hours as needed for wheezing or shortness of breath.    No facility-administered encounter medications on file as of 11/04/2019.     Allergies: Allergies  Allergen Reactions  . Percocet [Oxycodone-Acetaminophen] Other (See Comments)    Pt reports fainting  . Penicillins Hives    Has patient had a PCN reaction causing immediate rash, facial/tongue/throat swelling, SOB or lightheadedness with hypotension: Yes Has patient had a PCN reaction causing severe rash involving mucus membranes or skin necrosis: No Has patient had a PCN reaction that required hospitalization:  No Has patient had a PCN reaction occurring within the last 10 years: Yes If all of the above answers are "NO", then may proceed with Cephalosporin use.   Dennie Fetters [Erenumab-Aooe]     Hives with the  dose   . Emgality [Galcanezumab-Gnlm]     Shortness of breath 10/22/2019 pt states she thinks she is allergic, but does not know for sure. Please follow up with pt about emgality allergy.     Family History: Family History  Problem Relation Age of Onset  . Hypertension Mother   . High Cholesterol Mother   . CVA Mother 18  . Hypertension Father   . High Cholesterol Father   . Diabetes Sister   . Hypertension Sister     Social History: Social History   Socioeconomic History  . Marital status: Married    Spouse name: Josh  . Number of children: 2  . Years of education: Not on file  . Highest education level: Some college, no degree  Occupational History  . Occupation: Event organiser: Advertising copywriter  Social Needs  . Financial resource strain: Not on file  . Food insecurity    Worry: Not on file    Inability: Not on file  . Transportation needs    Medical: Not on file    Non-medical: Not on file  Tobacco Use  . Smoking status: Never Smoker  . Smokeless tobacco: Never Used  Substance and Sexual Activity  . Alcohol use: Never    Frequency: Never  . Drug use: Never  . Sexual activity: Yes    Comment: vasectomy-1st intercourse 28yo-Fewer than 5 partners  Lifestyle  . Physical activity    Days per week: Not on file    Minutes per session: Not on file  . Stress: Not on file  Relationships  . Social Musician on phone: Not on file    Gets together: Not on file    Attends religious service: Not on file    Active member of club or organization: Not on file    Attends meetings of clubs or organizations: Not on file    Relationship status: Not on file  . Intimate partner violence    Fear of current or ex partner: Not on file    Emotionally  abused: Not on file    Physically abused: Not on file    Forced sexual activity: Not on file  Other Topics Concern  . Not on file  Social History Narrative   Patient is left-handed. She lives with her husband and 2 children. She avoids caffeine. She was exercising 5 x week until her son's school schedule changes recently.     Observations/Objective:   Height  (1.575 m), weight 192 lb (87.1 kg), last menstrual period 10/08/2019. No acute distress.  Alert and oriented.  Speech fluent and not dysarthric.  Language intact.  Eyes orthophoric on primary gaze.  Face symmetric.  Assessment and Plan:   Migraine with aura, without status migrainosus, not intractable.  Emgality likely not cause of the dyspnea and she would like to give  it another try.    1.  For preventative management, Emgality 2.  For abortive therapy, Ubrelvy 100mg  3.  Limit use of pain relievers to no more than 2 days out of week to prevent risk of rebound or medication-overuse headache. 4.  Keep headache diary 5.  Exercise, hydration, caffeine cessation, sleep hygiene, monitor for and avoid triggers 6.  Consider:  magnesium citrate 400mg  daily, riboflavin 400mg  daily, and coenzyme Q10 100mg  three times daily 7. Follow up in January as already scheduled.  Follow Up Instructions:    -I discussed the assessment and treatment plan with the patient. The patient was provided an opportunity to ask questions and all were answered. The patient agreed with the plan and demonstrated an understanding of the instructions.   The patient was advised to call back or seek an in-person evaluation if the symptoms worsen or if the condition fails to improve as anticipated.    Cira ServantAdam Robert Jaffe, DO

## 2019-11-04 ENCOUNTER — Telehealth (INDEPENDENT_AMBULATORY_CARE_PROVIDER_SITE_OTHER): Payer: 59 | Admitting: Neurology

## 2019-11-04 ENCOUNTER — Other Ambulatory Visit: Payer: Self-pay | Admitting: Family

## 2019-11-04 ENCOUNTER — Other Ambulatory Visit: Payer: Self-pay

## 2019-11-04 ENCOUNTER — Encounter: Payer: Self-pay | Admitting: Neurology

## 2019-11-04 VITALS — Ht 62.0 in | Wt 192.0 lb

## 2019-11-04 DIAGNOSIS — G43109 Migraine with aura, not intractable, without status migrainosus: Secondary | ICD-10-CM

## 2019-11-04 MED ORDER — CLINDAMYCIN HCL 150 MG PO CAPS: 150.0000 mg | ORAL_CAPSULE | Freq: Three times a day (TID) | ORAL | 0 refills | Status: AC

## 2019-11-11 ENCOUNTER — Ambulatory Visit
Admission: RE | Admit: 2019-11-11 | Discharge: 2019-11-11 | Disposition: A | Discharge status: Home / Self Care | Payer: 59 | Source: Ambulatory Visit | Attending: Family | Admitting: Family

## 2019-11-11 ENCOUNTER — Encounter: Payer: Self-pay | Admitting: Family

## 2019-11-11 ENCOUNTER — Other Ambulatory Visit: Payer: Self-pay | Admitting: Family

## 2019-11-11 DIAGNOSIS — R1011 Right upper quadrant pain: Secondary | ICD-10-CM

## 2019-11-11 DIAGNOSIS — R109 Unspecified abdominal pain: Secondary | ICD-10-CM

## 2019-11-11 DIAGNOSIS — K824 Cholesterolosis of gallbladder: Secondary | ICD-10-CM

## 2019-11-14 ENCOUNTER — Other Ambulatory Visit: Payer: Self-pay | Admitting: Surgery

## 2019-11-14 ENCOUNTER — Other Ambulatory Visit (HOSPITAL_COMMUNITY): Payer: Self-pay | Admitting: Surgery

## 2019-11-14 DIAGNOSIS — K824 Cholesterolosis of gallbladder: Secondary | ICD-10-CM

## 2019-11-25 ENCOUNTER — Other Ambulatory Visit: Payer: Self-pay

## 2019-11-25 ENCOUNTER — Encounter (HOSPITAL_COMMUNITY)
Admission: RE | Admit: 2019-11-25 | Discharge: 2019-11-25 | Disposition: A | Discharge status: Home / Self Care | Payer: 59 | Source: Ambulatory Visit | Attending: Surgery | Admitting: Surgery

## 2019-11-25 DIAGNOSIS — K824 Cholesterolosis of gallbladder: Secondary | ICD-10-CM | POA: Insufficient documentation

## 2019-11-25 MED ORDER — TECHNETIUM TC 99M MEBROFENIN IV KIT
5.1800 | PACK | Freq: Once | INTRAVENOUS | Status: AC | PRN
  Administered 2019-11-25: 5.18 via INTRAVENOUS

## 2020-01-02 ENCOUNTER — Other Ambulatory Visit: Payer: Self-pay | Admitting: Family

## 2020-01-08 ENCOUNTER — Telehealth: Payer: Self-pay | Admitting: Neurology

## 2020-01-08 ENCOUNTER — Other Ambulatory Visit: Payer: Self-pay

## 2020-01-08 MED ORDER — ONDANSETRON 8 MG PO TBDP: 8.0000 mg | ORAL_TABLET | Freq: Three times a day (TID) | ORAL | 5 refills | Status: DC | PRN

## 2020-01-08 NOTE — Telephone Encounter (Signed)
Patient is coming by to get samples

## 2020-01-08 NOTE — Telephone Encounter (Signed)
Patient states that she needs to talk to someone abut her having a Migraine for the last week she is throwing up with the migraine also.   She states that the pharmacy told her that she has to have prior auth for the Emgality medication due to the start of the new year. She can not get that filled until we get it the prior auth  Done

## 2020-01-08 NOTE — Telephone Encounter (Signed)
If we have two samples, she can come to the office and we can give her the loading dose (2 injections) here, which will cover her until her next dose (all subsequent doses are 1 injection a month)

## 2020-01-08 NOTE — Telephone Encounter (Signed)
Can you rx anything until emgality is approved

## 2020-01-09 NOTE — Progress Notes (Signed)
Per pharmacy a PA must be submitted for patient to use the copay card.  Key: [id number]  States in 24 hours questions will need to be answered to complete the PA

## 2020-01-13 NOTE — Progress Notes (Signed)
Virtual Visit via Video Note The purpose of this virtual visit is to provide medical care while limiting exposure to the novel coronavirus.    Consent was obtained for video visit:  Yes.   Answered questions that patient had about telehealth interaction:  Yes.   I discussed the limitations, risks, security and privacy concerns of performing an evaluation and management service by telemedicine. I also discussed with the patient that there may be a patient responsible charge related to this service. The patient expressed understanding and agreed to proceed.  Pt location: Home Physician Location: office Name of referring provider:  Shanon Rosser, PA-C I connected with Georgiann Hahn at patients initiation/request on 01/14/2020 at  1:30 PM EST by video enabled telemedicine application and verified that I am speaking with the correct person using two identifiers. Pt MRN:  [id number] Pt DOB:  1991-12-14 Video Participants:  Georgiann Hahn   History of Present Illness:  Angelica Coleman is a 29year old left-handed womanwithmitral regurgitation, palpitations and history of recurrent kidney stones who follows up for migraine with aura.  UPDATE: Overall, migraines have been well-controlled.   Intensity:Moderate  Duration: Usually within an hour off and on. Frequency:1 days in a month. However, there was a problem with her insurance regarding getting her Emgality, so she was unable to take her dose in time.  She started to have a severe migraine.  She finally took her Emgality last Friday.  While headache is currently not too bad, she still has mild headache but more prominently dizziness, nausea and difficulty focusing. Dizzy nausea, trouble focusing  Rescue protocol:  Ubrelvy or naproxen Current NSAID: Naproxen 550mg  Current analgesic:None Current anti-emetic:  Zofran ODT 8mg  Current triptan:None Current antihypertensive: None Current antidepressant: None Current anti-CGRP:  Emgality; Ubrelvy100mg (abortive)  Caffeine: No Exercise: Trying to exercise Depression: No; Anxiety: Some Sleep hygiene: Good.  HISTORY: In 2016, while pregnant with her son, she developed numbness and tingling of her hands and nose associated with vision loss in both eyes described as only seeing the outline of whatever she looks at with blurred vision. Numbness lasts 20 minutes but visual disturbance lasts longer. It is followed by severe pounding headache on crown lasting into the next day. It is associated with nausea, photophobia but no unilateral weakness. It is aggravated by looking at computer screens or fluorescent lights. It is relieved by resting in dark room.  In September 2019, she got home from work and had another episode. She went to bed and when she woke up, she was fine. Later the next day, symptoms returned. She had associated fatigue. Symptoms lasted about 20 minutes and followed by the headache. She went to Urgent Care and was referred to neurology.  In January 2020,she woke up with excruciating headache. Sumatriptan effective after 2nd dose. She developed numbness in the right hand. The next day, she woke up and her right hand and forearm was still numb. She went to work and she had trouble using the keyboard on her computer due to difficulty with her fingers in the right hand. That night, it started hurting. Then on Wednesday, she was well. On Thursday, she developed sudden right sided numbness face, arm, torso and leg, which lasted about 24 hours. She was having trouble with memory such as names and events. She had an MRI of brain with and without contrast performed on 01/08/19, which demonstrated 3 mm enhancing nodular focus in the superficial left parietal region with small amount of adjacent parenchymal T2/FLAIR hyperintensity. Differential mentioned  includes "questionable small subacute infarct with parenchymal enhancement versus possible  mycotic aneurysm with adjacent inflammatory changes versus other." Imaging not available to personally review. She was started on ASA 325mg  daily by her PCP for possible stroke. She was feeling fatigued for the next couple of weeks. No fevers. She started feeling better last week. Every now and then she notes random bursts of scalp neuralgia (burning/needles) for 5 minutes. Due to findings on brain MRI from January, she underwent workup.CTA of head and neck from 01/29/19 personally reviewed showed mild cervical ICA tortuosity at the C2 level but otherwise no aneurysm or vascular occlusion or stenosis. She presented to the ED on 02/11/19 for right sided numbness and tingling and lightheadedness. MRI of brain with and without contrast was personally reviewed and demonstrated a single nonspecific hyperintense T2 focus in the left parietal white matter. She was treated with a headache cocktail. Repeat MRI of brain with and without contrast from 05/22/19 was personally reviewed and was unchanged.  She took ibuprofen 200mg  which was ineffective. 600mg  helped to at least go to sleep. She did not tolerate Excedrin.  Shepreviously tookToprol XL 25mg  for palpitations. Palpitations have been stronger lately and usually occur during her menstrual cycle.  Family history: Mother had stroke in her 54s. Shealso oncehad a migraine with stroke-like symptoms but no prior history of migraines. Her uncle also had a migraine with stroke-like symptoms as well.  Past NSAIDS: ibuprofen Past antihypertensive: Toprol XL (palpitations), propranolol ER 60mg  (palpitations) Past antidepressant: Nortriptyline (nightmares) Past anti-CGRP: Aimovig 140mg  (effective, hives) Contraindicated medications: Triptans (stroke-like symptoms with migraine); topiramate (history of recurrent kidney stones) Past anti-emetic:  Zofran ODT 8mg   Past Medical History: Past Medical History:  Diagnosis Date  . Chronic  bronchitis (HCC)    as a child  . Kidney stones   . Migraines     Medications: Outpatient Encounter Medications as of 01/14/2020  Medication Sig Note  . albuterol (PROVENTIL) (2.5 MG/3ML) 0.083% nebulizer solution Inhale 2.5 mg into the lungs every 4 (four) hours as needed for wheezing or shortness of breath.   . Galcanezumab-gnlm (EMGALITY) 120 MG/ML SOAJ Inject 240 mg into the skin every 30 (thirty) days. Loading dose   . Galcanezumab-gnlm (EMGALITY) 120 MG/ML SOAJ Inject 120 mg into the skin every 30 (thirty) days. 10/22/2019: Normal dose! Took double dose for first dose.   ketoconazole (NIZORAL) 2 % shampoo APPLY 1 APPLICATION TOPICALLY 2 (TWO) TIMES A WEEK.   . ondansetron (ZOFRAN ODT) 8 MG disintegrating tablet Take 1 tablet (8 mg total) by mouth every 8 (eight) hours as needed for nausea or vomiting.   56s Ubrogepant (UBRELVY) 100 MG TABS Take 100 mg by mouth every 2 (two) hours as needed. Max 200 mg 24 hrs (Patient taking differently: Take 100 mg by mouth every 2 (two) hours as needed. Max 200 mg 24 hrs Pt using co-pay card)   . VENTOLIN HFA 108 (90 Base) MCG/ACT inhaler Inhale 1-2 puffs into the lungs every 4 (four) hours as needed for wheezing or shortness of breath.    No facility-administered encounter medications on file as of 01/14/2020.    Allergies: Allergies  Allergen Reactions  . Percocet [Oxycodone-Acetaminophen] Other (See Comments)    Pt reports fainting  . Penicillins Hives    Has patient had a PCN reaction causing immediate rash, facial/tongue/throat swelling, SOB or lightheadedness with hypotension: Yes Has patient had a PCN reaction causing severe rash involving mucus membranes or skin necrosis: No Has patient had  a PCN reaction that required hospitalization: No Has patient had a PCN reaction occurring within the last 10 years: Yes If all of the above answers are "NO", then may proceed with Cephalosporin use.   Dennie Fetters [Erenumab-Aooe]     Hives with the  140mg  dose   . Emgality [Galcanezumab-Gnlm]     Shortness of breath 10/22/2019 pt states she thinks she is allergic, but does not know for sure. Please follow up with pt about emgality allergy.     Family History: Family History  Problem Relation Age of Onset  . Hypertension Mother   . High Cholesterol Mother   . CVA Mother 56  . Hypertension Father   . High Cholesterol Father   . Diabetes Sister   . Hypertension Sister     Social History: Social History   Socioeconomic History  . Marital status: Married    Spouse name: Josh  . Number of children: 2  . Years of education: Not on file  . Highest education level: Some college, no degree  Occupational History  . 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 Smoking status: Never Smoker  . Smokeless tobacco: Never Used  Substance and Sexual Activity  . Alcohol use: Never  . Drug use: Never  . Sexual activity: Yes    Comment: vasectomy-1st intercourse 29yo-Fewer than 5 partners  Other Topics Concern  . Not on file  Social History Narrative   Patient is left-handed. She lives with her husband and 2 children. She avoids caffeine. She was exercising 5 x week until her son's school schedule changes recently.    Social Determinants of Health   Financial Resource Strain:   . Difficulty of Paying Living Expenses: Not on file  Food Insecurity:   . Worried About Advertising copywriter in the Last Year: Not on file  . Ran Out of Food in the Last Year: Not on file  Transportation Needs:   . Lack of Transportation (Medical): Not on file  . Lack of Transportation (Non-Medical): Not on file  Physical Activity:   . Days of Exercise per Week: Not on file  . Minutes of Exercise per Session: Not on file  Stress:   . Feeling of Stress : Not on file  Social Connections:   . Frequency of Communication with Friends and Family: Not on file  . Frequency of Social Gatherings with Friends and Family: Not on file  .  Attends Religious Services: Not on file  . Active Member of Clubs or Organizations: Not on file  . Attends Programme researcher, broadcasting/film/video Meetings: Not on file  . Marital Status: Not on file  Intimate Partner Violence:   . Fear of Current or Ex-Partner: Not on file  . Emotionally Abused: Not on file  . Physically Abused: Not on file  . Sexually Abused: Not on file    Observations/Objective:   Height 5\' 2"  (1.575 m), weight 190 lb (86.2 kg). No acute distress.  Alert and oriented.  Speech fluent and not dysarthric.  Language intact.   Assessment and Plan:   Migraine with aura, without status migrainosus, intractable  1. To break current intractable migraine aura, prednisone taper (advised not to take with naproxen)  2. For preventative management, Emgality 3.  For abortive therapy:  Ubrelvy and/or naproxen 550mg  4. Zofran for nausea 5.  Limit use of pain relievers to no more than 2 days out of week to prevent risk of rebound or medication-overuse headache.  6.  Keep headache diary 7.  Exercise, hydration, caffeine cessation, sleep hygiene, monitor for and avoid triggers 8. Follow up 5 months   Follow Up Instructions:    -I discussed the assessment and treatment plan with the patient. The patient was provided an opportunity to ask questions and all were answered. The patient agreed with the plan and demonstrated an understanding of the instructions.   The patient was advised to call back or seek an in-person evaluation if the symptoms worsen or if the condition fails to improve as anticipated.   Cira Servant, DO

## 2020-01-14 ENCOUNTER — Telehealth (INDEPENDENT_AMBULATORY_CARE_PROVIDER_SITE_OTHER): Payer: 59 | Admitting: Neurology

## 2020-01-14 ENCOUNTER — Encounter: Payer: Self-pay | Admitting: Neurology

## 2020-01-14 ENCOUNTER — Other Ambulatory Visit: Payer: Self-pay

## 2020-01-14 VITALS — Ht 62.0 in | Wt 190.0 lb

## 2020-01-14 DIAGNOSIS — G43119 Migraine with aura, intractable, without status migrainosus: Secondary | ICD-10-CM | POA: Diagnosis not present

## 2020-01-14 MED ORDER — ONDANSETRON 8 MG PO TBDP: 8.0000 mg | ORAL_TABLET | Freq: Three times a day (TID) | ORAL | 11 refills | Status: AC | PRN

## 2020-01-14 MED ORDER — NAPROXEN SODIUM 550 MG PO TABS: 550.0000 mg | ORAL_TABLET | Freq: Two times a day (BID) | ORAL | 3 refills | Status: AC | PRN

## 2020-01-14 MED ORDER — PREDNISONE 10 MG PO TABS: ORAL_TABLET | ORAL | 0 refills | Status: DC

## 2020-01-14 MED ORDER — UBRELVY 100 MG PO TABS: 100.0000 mg | ORAL_TABLET | ORAL | 11 refills | Status: AC | PRN

## 2020-01-15 NOTE — Progress Notes (Signed)
Emgality PA started in cover my meds KEY: [id number]

## 2020-01-17 NOTE — Progress Notes (Signed)
PA for emgality approved

## 2020-01-17 NOTE — Progress Notes (Signed)
Approval effective through 07/14/2020 [id number] Letter sent to scan into her chat

## 2020-01-20 ENCOUNTER — Other Ambulatory Visit: Payer: Self-pay

## 2020-01-20 ENCOUNTER — Ambulatory Visit: Payer: 59 | Attending: Internal Medicine

## 2020-01-20 DIAGNOSIS — Z20822 Contact with and (suspected) exposure to covid-19: Secondary | ICD-10-CM

## 2020-01-22 LAB — NOVEL CORONAVIRUS, NAA: SARS-CoV-2, NAA: NOT DETECTED

## 2020-04-27 ENCOUNTER — Telehealth: Payer: Self-pay | Admitting: Neurology

## 2020-04-27 NOTE — Telephone Encounter (Signed)
She may come in to pick up samples of Nurtec, which is a dissolvable pill.  Take 1 tablet daily as needed (maximum 1 tablet in 24 hours).  Provide her a copay card in case she wants a prescription.

## 2020-04-27 NOTE — Telephone Encounter (Signed)
Pt called nurtec 75 # 3 lot [id number] b expr 08-2021.

## 2020-04-27 NOTE — Telephone Encounter (Signed)
Please advise on medication recommendation

## 2020-04-27 NOTE — Telephone Encounter (Signed)
Pt states that she has had really bad migraine and the [name] is not working. She would like to know if she can come by and pick up some samples of the rescue medication that Everlena Cooper was talking about he could give her. Please call

## 2020-04-28 ENCOUNTER — Encounter: Payer: Self-pay | Admitting: Family

## 2020-04-28 ENCOUNTER — Ambulatory Visit (INDEPENDENT_AMBULATORY_CARE_PROVIDER_SITE_OTHER): Payer: 59 | Admitting: Family

## 2020-04-28 ENCOUNTER — Other Ambulatory Visit: Payer: Self-pay

## 2020-04-28 VITALS — BP 128/74 | HR 64 | Temp 98.2°F | Ht 62.0 in | Wt 198.2 lb

## 2020-04-28 DIAGNOSIS — L219 Seborrheic dermatitis, unspecified: Secondary | ICD-10-CM

## 2020-04-28 DIAGNOSIS — H9202 Otalgia, left ear: Secondary | ICD-10-CM

## 2020-04-28 MED ORDER — KETOCONAZOLE 2 % EX SHAM: 1.0000 "application " | MEDICATED_SHAMPOO | CUTANEOUS | 1 refills | Status: AC

## 2020-04-28 NOTE — Progress Notes (Signed)
Angelica Coleman is a 29 y.o. female with the following history as recorded in Epic[employer]:  There are no problems to display for this patient.   Current Outpatient Medications  Medication Sig Dispense Refill  . albuterol (PROVENTIL) (2.5 MG/3ML) 0.083% nebulizer solution Inhale 2.5 mg into the lungs every 4 (four) hours as needed for wheezing or shortness of breath.    . Galcanezumab-gnlm (EMGALITY) 120 MG/ML SOAJ Inject 120 mg into the skin every 30 (thirty) days. 1 pen 11  . [START ON 04/30/2020] ketoconazole (NIZORAL) 2 % shampoo Apply 1 application topically 2 (two) times a week. 120 mL 1  . naproxen sodium (ANAPROX) 550 MG tablet Take 1 tablet (550 mg total) by mouth 2 (two) times daily as needed for headache. 20 tablet 3  . ondansetron (ZOFRAN ODT) 8 MG disintegrating tablet Take 1 tablet (8 mg total) by mouth every 8 (eight) hours as needed for nausea or vomiting. 15 tablet 11  . Rimegepant Sulfate (NURTEC) 75 MG TBDP Take by mouth.    . Ubrogepant (UBRELVY) 100 MG TABS Take 100 mg by mouth every 2 (two) hours as needed. Max 200 mg 24 hrs 9 tablet 11  . VENTOLIN HFA 108 (90 Base) MCG/ACT inhaler Inhale 1-2 puffs into the lungs every 4 (four) hours as needed for wheezing or shortness of breath.     No current facility-administered medications for this visit.    Allergies: Percocet [oxycodone-acetaminophen], Penicillins, Aimovig [erenumab-aooe], and Emgality [galcanezumab-gnlm]  Past Medical History:  Diagnosis Date  . Chronic bronchitis (HCC)    as a child  . Kidney stones   . Migraines     Past Surgical History:  Procedure Laterality Date  . ECTOPIC PREGNANCY SURGERY    . kidney stone removal      Family History  Problem Relation Age of Onset  . Hypertension Mother   . High Cholesterol Mother   . CVA Mother 80  . Hypertension Father   . High Cholesterol Father   . Diabetes Sister   . Hypertension Sister     Social History   Tobacco Use  . Smoking status: Never Smoker  .  Smokeless tobacco: Never Used  Substance Use Topics  . Alcohol use: Never    Subjective:  Left ear pain x 3-4 days; is dealing with complicated migraine and just wanted to be sure that does not have an ear infection; had to take a Nurtec yesterday to break the headache cycle and is feeling better- some residual symptoms lingering;    Objective:  Vitals:   04/28/20 1110  BP: 128/74  Pulse: 64  Temp: 98.2 F (36.8 C)  TempSrc: Oral  SpO2: 98%  Weight: 198 lb 3.2 oz (89.9 kg)  Height: 5\' 2"  (1.575 m)    General: Well developed, well nourished, in no acute distress  Skin : Warm and dry.  Head: Normocephalic and atraumatic  Eyes: Sclera and conjunctiva clear; pupils round and reactive to light; extraocular movements intact  Ears: External normal; canals clear; tympanic membranes normal  Oropharynx: Pink, supple. No suspicious lesions  Neck: Supple without thyromegaly, adenopathy  Lungs: Respirations unlabored; clear to auscultation bilaterally without wheeze, rales, rhonchi  CVS exam: normal rate and regular rhythm.  Neurologic: Alert and oriented; speech intact; face symmetrical; moves all extremities well; CNII-XII intact without focal deficit   Assessment:  1. Left ear pain   2. Seborrheic dermatitis     Plan:  1. Physical exam is reassuring- suspect related to current migraine; follow-up  worse, no better. 2. Refill updated on Nizoral shampoo;  This visit occurred during the SARS-CoV-2 public health emergency.  Safety protocols were in place, including screening questions prior to the visit, additional usage of staff PPE, and extensive cleaning of exam room while observing appropriate contact time as indicated for disinfecting solutions.     No follow-ups on file.  No orders of the defined types were placed in this encounter.   Requested Prescriptions   Signed Prescriptions Disp Refills  . ketoconazole (NIZORAL) 2 % shampoo 120 mL 1    Sig: Apply 1 application  topically 2 (two) times a week.

## 2020-04-29 ENCOUNTER — Emergency Department (HOSPITAL_COMMUNITY)
Admission: EM | Admit: 2020-04-29 | Discharge: 2020-04-29 | Disposition: A | Discharge status: Home / Self Care | Payer: 59 | Attending: Emergency Medicine | Admitting: Emergency Medicine

## 2020-04-29 ENCOUNTER — Encounter (HOSPITAL_COMMUNITY): Payer: Self-pay

## 2020-04-29 ENCOUNTER — Other Ambulatory Visit: Payer: Self-pay

## 2020-04-29 ENCOUNTER — Telehealth: Payer: Self-pay

## 2020-04-29 ENCOUNTER — Emergency Department (HOSPITAL_COMMUNITY): Payer: 59

## 2020-04-29 DIAGNOSIS — R0789 Other chest pain: Secondary | ICD-10-CM

## 2020-04-29 DIAGNOSIS — G43001 Migraine without aura, not intractable, with status migrainosus: Secondary | ICD-10-CM | POA: Diagnosis not present

## 2020-04-29 DIAGNOSIS — Z79899 Other long term (current) drug therapy: Secondary | ICD-10-CM | POA: Insufficient documentation

## 2020-04-29 DIAGNOSIS — R079 Chest pain, unspecified: Secondary | ICD-10-CM | POA: Diagnosis present

## 2020-04-29 LAB — CBC
HCT: 42.3 % (ref 36.0–46.0)
Hemoglobin: 14.3 g/dL (ref 12.0–15.0)
MCH: 29.7 pg (ref 26.0–34.0)
MCHC: 33.8 g/dL (ref 30.0–36.0)
MCV: 87.8 fL (ref 80.0–100.0)
Platelets: 240 10*3/uL (ref 150–400)
RBC: 4.82 MIL/uL (ref 3.87–5.11)
RDW: 12.6 % (ref 11.5–15.5)
WBC: 5.8 10*3/uL (ref 4.0–10.5)
nRBC: 0 % (ref 0.0–0.2)

## 2020-04-29 LAB — BASIC METABOLIC PANEL
Anion gap: 8 (ref 5–15)
BUN: 12 mg/dL (ref 6–20)
CO2: 24 mmol/L (ref 22–32)
Calcium: 8.9 mg/dL (ref 8.9–10.3)
Chloride: 106 mmol/L (ref 98–111)
Creatinine, Ser: 0.74 mg/dL (ref 0.44–1.00)
GFR calc Af Amer: >60 mL/min (ref >60)
GFR calc non Af Amer: >60 mL/min (ref >60)
Glucose, Bld: 97 mg/dL (ref 70–99)
Potassium: 3.8 mmol/L (ref 3.5–5.1)
Sodium: 138 mmol/L (ref 135–145)

## 2020-04-29 LAB — I-STAT BETA HCG BLOOD, ED (MC, WL, AP ONLY): I-stat hCG, quantitative: <5 m[IU]/mL (ref <5)

## 2020-04-29 LAB — TROPONIN I (HIGH SENSITIVITY)
Troponin I (High Sensitivity): <2 ng/L (ref <18)
Troponin I (High Sensitivity): <2 ng/L (ref <18)

## 2020-04-29 MED ORDER — METOCLOPRAMIDE HCL 5 MG/ML IJ SOLN
10.0000 mg | Freq: Once | INTRAMUSCULAR | Status: AC
  Administered 2020-04-29: 10 mg via INTRAVENOUS
  Filled 2020-04-29: qty 2

## 2020-04-29 MED ORDER — KETOROLAC TROMETHAMINE 30 MG/ML IJ SOLN
30.0000 mg | Freq: Once | INTRAMUSCULAR | Status: AC
  Administered 2020-04-29: 30 mg via INTRAVENOUS
  Filled 2020-04-29: qty 1

## 2020-04-29 MED ORDER — SODIUM CHLORIDE 0.9% FLUSH
3.0000 mL | Freq: Once | INTRAVENOUS | Status: AC
  Administered 2020-04-29: 3 mL via INTRAVENOUS

## 2020-04-29 MED ORDER — DIPHENHYDRAMINE HCL 50 MG/ML IJ SOLN
25.0000 mg | Freq: Once | INTRAMUSCULAR | Status: AC
  Administered 2020-04-29: 25 mg via INTRAVENOUS
  Filled 2020-04-29: qty 1

## 2020-04-29 NOTE — ED Notes (Signed)
An After Visit Summary was printed and given to the patient. Discharge instructions given and no further questions at this time.  

## 2020-04-29 NOTE — Telephone Encounter (Signed)
Pt called stated that she had been having a migraine she took regular medications didn't help, went to PCP to be check for an ear infection that was normal. Pt husband picked up nurtec 04/27/20 she has taken both doses still has a headache. PT stated that last night she started having chest pain that was radiating up her neck and in her back she could not get comfortable pt advised she needed to go to the ER for evaluation. Pt verbalized understanding

## 2020-04-29 NOTE — Discharge Instructions (Addendum)
Followup with your doctor as needed

## 2020-04-29 NOTE — ED Triage Notes (Addendum)
Per note in chart at 0800 "Pt called stated that she had been having a migraine she took regular medications didn't help, went to PCP to be check for an ear infection that was normal. Pt husband picked up nurtec 04/27/20 she has taken both doses still has a headache. PT stated that last night she started having chest pain that was radiating up her neck and in her back she could not get comfortable pt advised she needed to go to the ER for evaluation. "  Pt states 2 weeks ago she thinks she had a panic attack.

## 2020-04-29 NOTE — ED Provider Notes (Signed)
[facility] DEPT Provider Note   CSN: [id number] Arrival date & time: 04/29/20  3825     History Chief Complaint  Patient presents with  . Chest Pain  . Migraine    Angelica Coleman is a 29 y.o. female.  Patient complains of a migraine headache that is not getting better she also had some neck discomfort  The history is provided by the patient. No language interpreter was used.  Migraine This is a new problem. The current episode started yesterday. The problem occurs constantly. The problem has not changed since onset.Associated symptoms include headaches. Pertinent negatives include no chest pain and no abdominal pain. Nothing aggravates the symptoms. Nothing relieves the symptoms. She has tried nothing for the symptoms. The treatment provided no relief.       Past Medical History:  Diagnosis Date  . Chronic bronchitis ([place])    as a child  . Kidney stones   . Migraines     There are no problems to display for this patient.   Past Surgical History:  Procedure Laterality Date  . ECTOPIC PREGNANCY SURGERY    . kidney stone removal       OB History    Gravida  3   Para  2   Term  2   Preterm      AB  1   Living  2     SAB      TAB      Ectopic  1   Multiple      Live Births              Family History  Problem Relation Age of Onset  . Hypertension Mother   . High Cholesterol Mother   . CVA Mother 88  . Hypertension Father   . High Cholesterol Father   . Diabetes Sister   . Hypertension Sister     Social History   Tobacco Use  . Smoking status: Never Smoker  . Smokeless tobacco: Never Used  Substance Use Topics  . Alcohol use: Never  . Drug use: Never    Home Medications Prior to Admission medications   Medication Sig Start Date End Date Taking? Authorizing Provider  Galcanezumab-gnlm (EMGALITY) 120 MG/ML SOAJ Inject 120 mg into the skin every 30 (thirty) days. 09/30/19  Yes Jaffe, Adam R, DO    ketoconazole (NIZORAL) 2 % shampoo Apply 1 application topically 2 (two) times a week. Patient taking differently: Apply 1 application topically as needed (scalp break out).  04/30/20  Yes Marrian Salvage, FNP  naproxen sodium (ALEVE) 220 MG tablet Take 220 mg by mouth 2 (two) times daily as needed (headache/pain).   Yes [provider]  naproxen sodium (ANAPROX) 550 MG tablet Take 1 tablet (550 mg total) by mouth 2 (two) times daily as needed for headache. Patient not taking: Reported on 04/29/2020 01/14/20   Pieter Partridge, DO  ondansetron (ZOFRAN ODT) 8 MG disintegrating tablet Take 1 tablet (8 mg total) by mouth every 8 (eight) hours as needed for nausea or vomiting. Patient not taking: Reported on 04/29/2020 01/14/20   Pieter Partridge, DO  Ubrogepant (UBRELVY) 100 MG TABS Take 100 mg by mouth every 2 (two) hours as needed. Max 200 mg 24 hrs Patient not taking: Reported on 04/29/2020 01/14/20   Pieter Partridge, DO    Allergies    Percocet [oxycodone-acetaminophen], Penicillins, and Aimovig [erenumab-aooe]  Review of Systems   Review of Systems  Constitutional: Negative  for appetite change and fatigue.  HENT: Negative for congestion, ear discharge and sinus pressure.   Eyes: Negative for discharge.  Respiratory: Negative for cough.   Cardiovascular: Negative for chest pain.  Gastrointestinal: Negative for abdominal pain and diarrhea.  Genitourinary: Negative for frequency and hematuria.  Musculoskeletal: Negative for back pain.  Skin: Negative for rash.  Neurological: Positive for headaches. Negative for seizures.  Psychiatric/Behavioral: Negative for hallucinations.    Physical Exam Updated Vital Signs BP 126/88   Pulse 72   Temp 98 F (36.7 C) (Oral)   Resp 19   LMP 04/09/2020   SpO2 98%   Physical Exam Vitals and nursing note reviewed.  Constitutional:      Appearance: She is well-developed.  HENT:     Head: Normocephalic.     Nose: Nose normal.  Eyes:      General: No scleral icterus.    Conjunctiva/sclera: Conjunctivae normal.  Neck:     Thyroid: No thyromegaly.  Cardiovascular:     Rate and Rhythm: Normal rate and regular rhythm.     Heart sounds: No murmur. No friction rub. No gallop.   Pulmonary:     Breath sounds: No stridor. No wheezing or rales.  Chest:     Chest wall: No tenderness.  Abdominal:     General: There is no distension.     Tenderness: There is no abdominal tenderness. There is no rebound.  Musculoskeletal:        General: Normal range of motion.     Cervical back: Neck supple.  Lymphadenopathy:     Cervical: No cervical adenopathy.  Skin:    Findings: No erythema or rash.  Neurological:     Mental Status: She is alert and oriented to person, place, and time.     Motor: No abnormal muscle tone.     Coordination: Coordination normal.  Psychiatric:        Behavior: Behavior normal.     ED Results / Procedures / Treatments   Labs (all labs ordered are listed, but only abnormal results are displayed) Labs Reviewed  BASIC METABOLIC PANEL  CBC  I-STAT BETA HCG BLOOD, ED ([facility], [facility], [facility] ONLY)  TROPONIN I (HIGH SENSITIVITY)  TROPONIN I (HIGH SENSITIVITY)    EKG EKG Interpretation  Date/Time:  Wednesday Apr 29 2020 09:00:23 EDT Ventricular Rate:  80 PR Interval:    QRS Duration: 99 QT Interval:  381 QTC Calculation: 440 R Axis:   30 Text Interpretation: Sinus arrhythmia Baseline wander in lead(s) II III aVF Confirmed by Bethann Berkshire [id number]) on 04/29/2020 11:47:12 AM   Radiology DG Chest 2 View  Result Date: 04/29/2020 CLINICAL DATA:  Chest pain. EXAM: CHEST - 2 VIEW COMPARISON:  07/11/2018 FINDINGS: The heart size and mediastinal contours are within normal limits. Both lungs are clear. The visualized skeletal structures are unremarkable. IMPRESSION: No active cardiopulmonary disease. Electronically Signed   By: Signa Kell M.D.   On: 04/29/2020 09:42    Procedures Procedures (including critical care  time)  Medications Ordered in ED Medications  sodium chloride flush (NS) 0.9 % injection 3 mL (3 mLs Intravenous Given 04/29/20 0910)  metoCLOPramide (REGLAN) injection 10 mg (10 mg Intravenous Given 04/29/20 0921)  ketorolac (TORADOL) 30 MG/ML injection 30 mg (30 mg Intravenous Given 04/29/20 0920)  diphenhydrAMINE (BENADRYL) injection 25 mg (25 mg Intravenous Given 04/29/20 0920)    ED Course  I have reviewed the triage vital signs and the nursing notes.  Pertinent labs & imaging results  that were available during my care of the patient were reviewed by me and considered in my medical decision making (see chart for details).    MDM Rules/Calculators/A&P                     Patient with migraine headache.  Labs unremarkable.  Patient feels much better after treatment and will follow up with her PCP     This patient presents to the ED for concern of headache, this involves an extensive number of treatment options, and is a complaint that carries with it a high risk of complications and morbidity.  The differential diagnosis includes migraine infection   Lab Tests:   I Ordered, reviewed, and interpreted labs, which included CBC chemistries unremarkable  Medicines ordered:   I ordered medication Toradol for pain  Imaging Studies ordered:   I ordered imaging studies which included chest x-ray and  I independently visualized and interpreted imaging which showed no acute disease  Additional history obtained:   Additional history obtained from records  Previous records obtained and reviewed   Consultations Obtained:  Reevaluation:  After the interventions stated above, I reevaluated the patient and found much improvement  Critical Interventions:  .    Final Clinical Impression(s) / ED Diagnoses Final diagnoses:  Atypical chest pain  Migraine without aura and with status migrainosus, not intractable    Rx / DC Orders ED Discharge Orders    None       Bethann Berkshire, MD 04/29/20 1155

## 2020-04-30 ENCOUNTER — Telehealth: Payer: Self-pay | Admitting: Neurology

## 2020-04-30 NOTE — Telephone Encounter (Signed)
Pt advised.

## 2020-04-30 NOTE — Telephone Encounter (Signed)
Patient states she has a migraine that she ended up in the ER for. They gave her a migraine cocktail but the migraine came back and is worse than before. She picked up her prescription but has questions regarding dosage and frequency. Please call.

## 2020-05-28 ENCOUNTER — Ambulatory Visit: Payer: 59 | Admitting: Neurology

## 2020-06-11 NOTE — Progress Notes (Signed)
Virtual Visit via Video Note The purpose of this virtual visit is to provide medical care while limiting exposure to the novel coronavirus.    Consent was obtained for video visit:  Yes.   Answered questions that patient had about telehealth interaction:  Yes.   I discussed the limitations, risks, security and privacy concerns of performing an evaluation and management service by telemedicine. I also discussed with the patient that there may be a patient responsible charge related to this service. The patient expressed understanding and agreed to proceed.  Pt location: Home Physician Location: office Name of referring provider:  Olive Bass,* I connected with Levi Aland at patients initiation/request on 06/15/2020 at  2:30 PM EDT by video enabled telemedicine application and verified that I am speaking with the correct person using two identifiers. Pt MRN:  [id number] Pt DOB:  1991-12-02 Video Participants:  Levi Aland   History of Present Illness:  Angelica Coleman is a 29year old left-handed womanwithmitral regurgitation, palpitations and history of recurrent kidney stones who follows up for migraine with aura.  UPDATE: She had an intractable migraine last month (associated with otalgia), not responding to [place], so she was given Nurtec which was also ineffective.  She went to the ED where she was given a headache cocktail which was not effective.  She notes that her migraines often occur week prior to menses.  She misses 2 to 3 days of work that week.  She continues to have rash and swelling where she injects the [employer]  She is going to be moving to [place] next month.  Intensity:Moderate  Duration: Usually within an houroff and on. Frequency:1days in a month. However, there was a problem with her insurance regarding getting her Emgality, so she was unable to take her dose in time.  She started to have a severe migraine.  She finally took her Emgality last  Friday.  While headache is currently not too bad, she still has mild headache but more prominently dizziness, nausea and difficulty focusing. Dizzy nausea, trouble focusing  Rescue protocol: Ubrelvy or naproxen Current NSAID: Naproxen 550mg  Current analgesic:None Current anti-emetic:  none Current triptan:None Current antihypertensive: None Current antidepressant: None Current anti-CGRP: Emgality; Ubrelvy100mg (abortive)  Caffeine: No Exercise: Trying to exercise Depression: No; Anxiety: Some Sleep hygiene: Good.  HISTORY: In 2016, while pregnant with her son, she developed numbness and tingling of her hands and nose associated with vision loss in both eyes described as only seeing the outline of whatever she looks at with blurred vision. Numbness lasts 20 minutes but visual disturbance lasts longer. It is followed by severe pounding headache on crown lasting into the next day. It is associated with nausea, photophobia but no unilateral weakness. It is aggravated by looking at computer screens or fluorescent lights. It is relieved by resting in dark room.  In September 2019, she got home from work and had another episode. She went to bed and when she woke up, she was fine. Later the next day, symptoms returned. She had associated fatigue. Symptoms lasted about 20 minutes and followed by the headache. She went to Urgent Care and was referred to neurology.  In January 2020,she woke up with excruciating headache. Sumatriptan effective after 2nd dose. She developed numbness in the right hand. The next day, she woke up and her right hand and forearm was still numb. She went to work and she had trouble using the keyboard on her computer due to difficulty with her fingers in the right hand.  A couple of days later, she developed sudden right sided numbness face, arm, torso and leg, which lasted about 24 hours. She was having trouble with memory such as names and  events. She had an MRI of brain with and without contrast performed on 01/08/19, which demonstrated 3 mm enhancing nodular focus in the superficial left parietal region with small amount of adjacent parenchymal T2/FLAIR hyperintensity. Differential mentioned includes "questionable small subacute infarct with parenchymal enhancement versus possible mycotic aneurysm with adjacent inflammatory changes versus other." Imaging not available to personally review. She was started on ASA 325mg  daily by her PCP for possible stroke. She was feeling fatigued for the next couple of weeks. No fevers. She started feeling better last week. Every now and then she notes random bursts of scalp neuralgia (burning/needles) for 5 minutes. Due to findings on brain MRI from January, she underwent workup.CTA of head and neck from 01/29/19 personally reviewed showed mild cervical ICA tortuosity at the C2 level but otherwise no aneurysm or vascular occlusion or stenosis. She presented to the ED on 02/11/19 for right sided numbness and tingling and lightheadedness. MRI of brain with and without contrast was personally reviewed and demonstrated a single nonspecific hyperintense T2 focus in the left parietal white matter. She was treated with a headache cocktail. Repeat MRI of brain with and without contrast from 05/22/19 was personally reviewed and was unchanged.  She continued to have headaches often with nausea, dizziness and difficulty focusing.  She took ibuprofen 200mg  which was ineffective. 600mg  helped to at least go to sleep. She did not tolerate Excedrin.  Shepreviously tookToprol XL 25mg  for palpitations. Palpitations have been stronger lately and usually occur during her menstrual cycle.  Family history: Mother had stroke in her 57s. Shealso oncehad a migraine with stroke-like symptoms but no prior history of migraines. Her uncle also had a migraine with stroke-like symptoms as well.  Past NSAIDS:  ibuprofen Past anti-emetic:  Zofran ODT 8mg  Past antihypertensive: Toprol XL (palpitations), propranolol ER 60mg  (palpitations) Past antidepressant: Nortriptyline (nightmares) Past anti-CGRP: Aimovig 140mg  (effective, hives) Contraindicated medications: Triptans (stroke-like symptoms with migraine); topiramate (history of recurrent kidney stones) Past anti-emetic:  Zofran ODT 8mg   Past Medical History: Past Medical History:  Diagnosis Date  . Chronic bronchitis ([facility])    as a child  . Kidney stones   . Migraines     Medications: Outpatient Encounter Medications as of 06/15/2020  Medication Sig Note  . Galcanezumab-gnlm (EMGALITY) 120 MG/ML SOAJ Inject 120 mg into the skin every 30 (thirty) days. 04/29/2020: 16th of every month  . ketoconazole (NIZORAL) 2 % shampoo Apply 1 application topically 2 (two) times a week. (Patient taking differently: Apply 1 application topically as needed (scalp break out). )   . naproxen sodium (ALEVE) 220 MG tablet Take 220 mg by mouth 2 (two) times daily as needed (headache/pain).   . naproxen sodium (ANAPROX) 550 MG tablet Take 1 tablet (550 mg total) by mouth 2 (two) times daily as needed for headache. (Patient not taking: Reported on 04/29/2020)   . ondansetron (ZOFRAN ODT) 8 MG disintegrating tablet Take 1 tablet (8 mg total) by mouth every 8 (eight) hours as needed for nausea or vomiting. (Patient not taking: Reported on 04/29/2020)   . Ubrogepant (UBRELVY) 100 MG TABS Take 100 mg by mouth every 2 (two) hours as needed. Max 200 mg 24 hrs (Patient not taking: Reported on 04/29/2020)    No facility-administered encounter medications on file as of 06/15/2020.    Allergies:  Allergies  Allergen Reactions  . Percocet [Oxycodone-Acetaminophen] Other (See Comments)    Pt reports fainting  . Penicillins Hives    Has patient had a PCN reaction causing immediate rash, facial/tongue/throat swelling, SOB or lightheadedness with hypotension: Yes Has patient had a  PCN reaction causing severe rash involving mucus membranes or skin necrosis: No Has patient had a PCN reaction that required hospitalization: No Has patient had a PCN reaction occurring within the last 10 years: Yes If all of the above answers are "NO", then may proceed with Cephalosporin use.   Martin Majestic [Erenumab-Aooe]     Hives with the 140mg  dose     Family History: Family History  Problem Relation Age of Onset  . Hypertension Mother   . High Cholesterol Mother   . CVA Mother 1  . Hypertension Father   . High Cholesterol Father   . Diabetes Sister   . Hypertension Sister     Social History: Social History   Socioeconomic History  . Marital status: Married    Spouse name: Josh  . Number of children: 2  . Years of education: Not on file  . Highest education level: Some college, no degree  Occupational History  . 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 Smoking status: Never Smoker  . Smokeless tobacco: Never Used  Vaping Use  . Vaping Use: Never used  Substance and Sexual Activity  . Alcohol use: Never  . Drug use: Never  . Sexual activity: Yes    Comment: vasectomy-1st intercourse 29yo-Fewer than 5 partners  Other Topics Concern  . Not on file  Social History Narrative   Patient is left-handed. She lives with her husband and 2 children. She avoids caffeine. She was exercising 5 x week until her son's school schedule changes recently.    Social Determinants of Health   Financial Resource Strain:   . Difficulty of Paying Living Expenses:   Food Insecurity:   . Worried About Charity fundraiser in the Last Year:   . Arboriculturist in the Last Year:   Transportation Needs:   . Film/video editor (Medical):   Marland Kitchen Lack of Transportation (Non-Medical):   Physical Activity:   . Days of Exercise per Week:   . Minutes of Exercise per Session:   Stress:   . Feeling of Stress :   Social Connections:   . Frequency of Communication  with Friends and Family:   . Frequency of Social Gatherings with Friends and Family:   . Attends Religious Services:   . Active Member of Clubs or Organizations:   . Attends Archivist Meetings:   Marland Kitchen Marital Status:   Intimate Partner Violence:   . Fear of Current or Ex-Partner:   . Emotionally Abused:   Marland Kitchen Physically Abused:   . Sexually Abused:     Observations/Objective:   There were no vitals taken for this visit. No acute distress.  Alert and oriented.  Speech fluent and not dysarthric.  Language intact.  Eyes orthophoric on primary gaze.  Face symmetric.  Assessment and Plan:   Migraine with aura, without status migrainosus, not intractable.   1.  For preventative management, will stop Emgality due to injection site reaction.  Instead, start venlafaxine XR 37.5mg  daily for one week, then increase to 75mg  daily. 2.  For abortive therapy, Ubrelvy 100mg  3.  Limit use of pain relievers to no more than 2 days out of week to  prevent risk of rebound or medication-overuse headache. 4.  Keep headache diary 5.  Exercise, hydration, caffeine cessation, sleep hygiene, monitor for and avoid triggers 6.  Will have her follow up for virtual visit in 4 months in case she hasn't yet been able to establish care with new neurologist by that time.  Otherwise, she will cancel follow up.   Follow Up Instructions:    -I discussed the assessment and treatment plan with the patient. The patient was provided an opportunity to ask questions and all were answered. The patient agreed with the plan and demonstrated an understanding of the instructions.   The patient was advised to call back or seek an in-person evaluation if the symptoms worsen or if the condition fails to improve as anticipated.    Cira Servant, DO

## 2020-06-15 ENCOUNTER — Telehealth (INDEPENDENT_AMBULATORY_CARE_PROVIDER_SITE_OTHER): Payer: 59 | Admitting: Neurology

## 2020-06-15 ENCOUNTER — Encounter: Payer: Self-pay | Admitting: Neurology

## 2020-06-15 ENCOUNTER — Other Ambulatory Visit: Payer: Self-pay

## 2020-06-15 DIAGNOSIS — G43109 Migraine with aura, not intractable, without status migrainosus: Secondary | ICD-10-CM | POA: Diagnosis not present

## 2020-06-15 MED ORDER — VENLAFAXINE HCL ER 37.5 MG PO CP24
ORAL_CAPSULE | ORAL | 0 refills | Status: DC
Start: 2020-06-15 — End: 2020-07-24

## 2020-06-15 NOTE — Patient Instructions (Signed)
1.  Stop emgality 2.  Start venlafaxine XR 37.5mg .  Take 1 pill every morning for one week, then increase to 2 pills every morning. 3.  Use Ubrelvy as needed. 4.  Follow up in 4 months.

## 2020-07-22 ENCOUNTER — Telehealth: Payer: Self-pay | Admitting: Neurology

## 2020-07-22 NOTE — Telephone Encounter (Signed)
Patient called in and is not wanting to take the Effexor anymore. She is having worse headaches than she has had in a very long time and they are practically daily. She is nauseous and dizzy a lot. She would like to see if there is something else.

## 2020-07-23 NOTE — Telephone Encounter (Signed)
She can stop the venlafaxine.  I would like to start Nurtec 75mg  tablet every other day.  It is a medication in the same family as Aimovig and Emgality but it is a dissolvable pill.  Initially it was used just as rescue medication but has since been approved as a preventative (take one every other day).  If agreeable, we can send in a prescription for 1 tablet every other day, quantity 16, refills 5.  She may come by the office and she can pickup a copay card.

## 2020-07-23 NOTE — Telephone Encounter (Signed)
Tried calling pt, No answer. Unable to leave vm

## 2020-07-24 ENCOUNTER — Other Ambulatory Visit: Payer: Self-pay

## 2020-07-24 DIAGNOSIS — G43109 Migraine with aura, not intractable, without status migrainosus: Secondary | ICD-10-CM

## 2020-07-24 MED ORDER — NURTEC 75 MG PO TBDP: 75.0000 mg | ORAL_TABLET | ORAL | 5 refills | Status: DC

## 2020-07-24 NOTE — Telephone Encounter (Signed)
Telephone call back to pt, Nurtec 75 mg sent to [facility] in [place] [phone]

## 2020-07-29 ENCOUNTER — Telehealth: Payer: Self-pay | Admitting: Neurology

## 2020-07-29 NOTE — Telephone Encounter (Signed)
Patient called in and stated the medication refill that was sent in couldn't be picked up. The pharmacy told her they needed authorization from our office.

## 2020-07-29 NOTE — Telephone Encounter (Signed)
Pt advised she needs PA for her medication.    Chelsea when you get a chance can you start the PA Please

## 2020-08-04 ENCOUNTER — Encounter: Payer: Self-pay | Admitting: Neurology

## 2020-08-04 NOTE — Progress Notes (Addendum)
Angelica Coleman (Key: [id number]) Nurtec 75MG  dispersible tablets   Form [employer] Electronic Prior Authorization Form (2017 NCPDP) Created 2 hours ago Sent to Plan 2 hours ago Plan Response 2 hours ago Submit Clinical Questions 2 hours ago Determination Favorable 4 minutes ago Message from Plan Request Reference Number: [id number]. NURTEC TAB 75MG  ODT is approved through 08/05/2021. Your patient may now fill this prescription and it will be covered.

## 2020-08-05 NOTE — Telephone Encounter (Signed)
Called patient and left her a vm to let her know that her Nurtec medication was approved through her insurance so she can pick it up at her pharm whenever they get it filled. Also, updated approval info in her chart. Thanks!

## 2020-08-18 ENCOUNTER — Encounter: Payer: Self-pay | Admitting: Family

## 2020-10-19 NOTE — Progress Notes (Signed)
Virtual Visit via Video Note The purpose of this virtual visit is to provide medical care while limiting exposure to the novel coronavirus.    Consent was obtained for video visit:  Yes.   Answered questions that patient had about telehealth interaction:  Yes.   I discussed the limitations, risks, security and privacy concerns of performing an evaluation and management service by telemedicine. I also discussed with the patient that there may be a patient responsible charge related to this service. The patient expressed understanding and agreed to proceed.  Pt location: Home Physician Location: office Name of referring provider:  Olive Bass,* I connected with Levi Aland at patients initiation/request on 10/20/2020 at  8:30 AM EDT by video enabled telemedicine application and verified that I am speaking with the correct person using two identifiers. Pt MRN:  [id number] Pt DOB:  Dec 09, 1991 Video Participants:  Levi Aland   History of Present Illness:  Angelica Coleman is a 29year old left-handed womanwithmitral regurgitation, palpitations and history of recurrent kidney stones who follows up for migraines.  UPDATE: She moved to [place] in July.  Started venlafaxine in June but it caused increased headaches.  She was started on Nurtec however her insurance wouldn't approve it every other day.  So she is taking 1 to 2 days a week.  She has had 3 or 4 in past 30 days lasting a couple of hours.  No severe migraines.  She is currently trying to establish care with a new local neurologist.  Intensity:Moderate  Duration: Usually within an houroff and on. Frequency:1days in a month. However, there was a problem with her insurance regarding getting her Emgality, so she was unable to take her dose in time. She started to have a severe migraine. She finally took her Emgality last Friday. While headache is currently not too bad, she still has mild headache but more  prominently dizziness, nausea and difficulty focusing. Dizzy nausea, trouble focusing  Rescue protocol:Naproxen 550mg  Current NSAID: Naproxen 550mg  Current analgesic:None Current anti-emetic: Zofran Current triptan:None Current antihypertensive: None Current antidepressant: none Current anti-CGRP: Nurtec 75mg , Ubrelvy  Caffeine: No Exercise: Trying to exercise Depression: No; Anxiety: Some Sleep hygiene: Good.  HISTORY: In 2016, while pregnant with her son, she developed numbness and tingling of her hands and nose associated with vision loss in both eyes described as only seeing the outline of whatever she looks at with blurred vision. Numbness lasts 20 minutes but visual disturbance lasts longer. It is followed by severe pounding headache on crown lasting into the next day. It is associated with nausea, photophobia but no unilateral weakness. It is aggravated by looking at computer screens or fluorescent lights. It is relieved by resting in dark room.  In September 2019, she got home from work and had another episode. She went to bed and when she woke up, she was fine. Later the next day, symptoms returned. She had associated fatigue. Symptoms lasted about 20 minutes and followed by the headache. She went to Urgent Care and was referred to neurology.  In January 2020,she woke up with excruciating headache. Sumatriptan effective after 2nd dose. She developed numbness in the right hand. The next day, she woke up and her right hand and forearm was still numb. She went to work and she had trouble using the keyboard on her computer due to difficulty with her fingers in the right hand. A couple of days later, she developed sudden right sided numbness face, arm, torso and leg, which lasted about  24 hours. She was having trouble with memory such as names and events. She had an MRI of brain with and without contrast performed on 01/08/19, which demonstrated 3 mm  enhancing nodular focus in the superficial left parietal region with small amount of adjacent parenchymal T2/FLAIR hyperintensity. Differential mentioned includes "questionable small subacute infarct with parenchymal enhancement versus possible mycotic aneurysm with adjacent inflammatory changes versus other." Imaging not available to personally review. She was started on ASA 325mg  daily by her PCP for possible stroke. She was feeling fatigued for the next couple of weeks. No fevers. She started feeling better last week. Every now and then she notes random bursts of scalp neuralgia (burning/needles) for 5 minutes. Due to findings on brain MRI from January, she underwent workup.CTA of head and neck from 01/29/19 personally reviewed showed mild cervical ICA tortuosity at the C2 level but otherwise no aneurysm or vascular occlusion or stenosis. She presented to the ED on 02/11/19 for right sided numbness and tingling and lightheadedness. MRI of brain with and without contrast was personally reviewed and demonstrated a single nonspecific hyperintense T2 focus in the left parietal white matter. She was treated with a headache cocktail. Repeat MRI of brain with and without contrast from 05/22/19 was personally reviewed and was unchanged.  She continued to have headaches often with nausea, dizziness and difficulty focusing.  She took ibuprofen 200mg  which was ineffective. 600mg  helped to at least go to sleep. She did not tolerate Excedrin.  Shepreviously tookToprol XL 25mg  for palpitations. Palpitations have been stronger lately and usually occur during her menstrual cycle.  Family history: Mother had stroke in her 2s. Shealso oncehad a migraine with stroke-like symptoms but no prior history of migraines. Her uncle also had a migraine with stroke-like symptoms as well.  Past NSAIDS: ibuprofen Past anti-emetic:  Zofran ODT 8mg  Past antihypertensive: Toprol XL (palpitations), propranolol  ER 60mg  (palpitations) Past antidepressant: Nortriptyline (nightmares), venlafaxine (increased headaches) Past anti-CGRP: Aimovig 140mg  (effective, hives), Emgality (injection site reaction), Ubrelvy Contraindicated medications: Triptans (stroke-like symptoms with migraine); topiramate (history of recurrent kidney stones) Past anti-emetic: Zofran ODT 8mg    Past Medical History: Past Medical History:  Diagnosis Date  . Chronic bronchitis (HCC)    as a child  . Kidney stones   . Migraines     Medications: Outpatient Encounter Medications as of 10/20/2020  Medication Sig  . ketoconazole (NIZORAL) 2 % shampoo Apply 1 application topically 2 (two) times a week.  . naproxen sodium (ALEVE) 220 MG tablet Take 220 mg by mouth 2 (two) times daily as needed (headache/pain).  . naproxen sodium (ANAPROX) 550 MG tablet Take 1 tablet (550 mg total) by mouth 2 (two) times daily as needed for headache.  . ondansetron (ZOFRAN ODT) 8 MG disintegrating tablet Take 1 tablet (8 mg total) by mouth every 8 (eight) hours as needed for nausea or vomiting. (Patient not taking: Reported on 04/29/2020)  . Rimegepant Sulfate (NURTEC) 75 MG TBDP Take 75 mg by mouth every other day.  Ubrogepant (UBRELVY) 100 MG TABS Take 100 mg by mouth every 2 (two) hours as needed. Max 200 mg 24 hrs   No facility-administered encounter medications on file as of 10/20/2020.    Allergies: Allergies  Allergen Reactions  . Percocet [Oxycodone-Acetaminophen] Other (See Comments)    Pt reports fainting  . Penicillins Hives    Has patient had a PCN reaction causing immediate rash, facial/tongue/throat swelling, SOB or lightheadedness with hypotension: Yes Has patient had a PCN reaction causing  severe rash involving mucus membranes or skin necrosis: No Has patient had a PCN reaction that required hospitalization: No Has patient had a PCN reaction occurring within the last 10 years: Yes If all of the above answers are "NO",  then may proceed with Cephalosporin use.   Dennie Fetters [Erenumab-Aooe]     Hives with the 140mg  dose     Family History: Family History  Problem Relation Age of Onset  . Hypertension Mother   . High Cholesterol Mother   . CVA Mother 54  . Hypertension Father   . High Cholesterol Father   . Diabetes Sister   . Hypertension Sister     Social History: Social History   Socioeconomic History  . Marital status: Married    Spouse name: Josh  . Number of children: 2  . Years of education: Not on file  . Highest education level: Some college, no degree  Occupational History  . 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 Smoking status: Never Smoker  . Smokeless tobacco: Never Used  Vaping Use  . Vaping Use: Never used  Substance and Sexual Activity  . Alcohol use: Never  . Drug use: Never  . Sexual activity: Yes    Comment: vasectomy-1st intercourse 29yo-Fewer than 5 partners  Other Topics Concern  . Not on file  Social History Narrative   Patient is left-handed. She lives with her husband and 2 children. She avoids caffeine. She was exercising 5 x week until her son's school schedule changes recently.    Social Determinants of Health   Financial Resource Strain:   . Difficulty of Paying Living Expenses: Not on file  Food Insecurity:   . Worried About Advertising copywriter in the Last Year: Not on file  . Ran Out of Food in the Last Year: Not on file  Transportation Needs:   . Lack of Transportation (Medical): Not on file  . Lack of Transportation (Non-Medical): Not on file  Physical Activity:   . Days of Exercise per Week: Not on file  . Minutes of Exercise per Session: Not on file  Stress:   . Feeling of Stress : Not on file  Social Connections:   . Frequency of Communication with Friends and Family: Not on file  . Frequency of Social Gatherings with Friends and Family: Not on file  . Attends Religious Services: Not on file  . Active Member  of Clubs or Organizations: Not on file  . Attends Programme researcher, broadcasting/film/video Meetings: Not on file  . Marital Status: Not on file  Intimate Partner Violence:   . Fear of Current or Ex-Partner: Not on file  . Emotionally Abused: Not on file  . Physically Abused: Not on file  . Sexually Abused: Not on file    Observations/Objective:   Height 5\' 2"  (1.575 m), weight 200 lb (90.7 kg). No acute distress.  Alert and oriented.  Speech fluent and not dysarthric.  Language intact.  Eyes orthophoric on primary gaze.  Face symmetric.  Assessment and Plan:   Migraine with aura, without status migrainosus, not intractable.  Her insurance would only approve her for 8 Nurtec.  However, I want to prescribe it to her as a preventative, which is taken every other day.  I will send a new prescription to the mail order pharmacy [facility] where they may help get her the medication through the assistance program.  1.  For preventative management, Nurtec 75mg  every other day.  She will contact me if she has any problems. 2.  For abortive therapy, she may use Ubrelvy.  Also naproxen if needed. 3.  Limit use of pain relievers to no more than 2 days out of week to prevent risk of rebound or medication-overuse headache. 4.  Keep headache diary 5.  Exercise, hydration, caffeine cessation, sleep hygiene, monitor for and avoid triggers 6.  Follow up via video visit 4 to 6 months if still without a new neurologist.   Follow Up Instructions:    -I discussed the assessment and treatment plan with the patient. The patient was provided an opportunity to ask questions and all were answered. The patient agreed with the plan and demonstrated an understanding of the instructions.   The patient was advised to call back or seek an in-person evaluation if the symptoms worsen or if the condition fails to improve as anticipated.    Cira ServantAdam Robert Foye Damron, DO

## 2020-10-20 ENCOUNTER — Telehealth (INDEPENDENT_AMBULATORY_CARE_PROVIDER_SITE_OTHER): Payer: 59 | Admitting: Neurology

## 2020-10-20 ENCOUNTER — Encounter: Payer: Self-pay | Admitting: Neurology

## 2020-10-20 ENCOUNTER — Other Ambulatory Visit: Payer: Self-pay

## 2020-10-20 VITALS — Ht 62.0 in | Wt 200.0 lb

## 2020-10-20 DIAGNOSIS — G43109 Migraine with aura, not intractable, without status migrainosus: Secondary | ICD-10-CM | POA: Diagnosis not present

## 2020-10-20 MED ORDER — NURTEC 75 MG PO TBDP: 75.0000 mg | ORAL_TABLET | ORAL | 5 refills | Status: AC

## 2020-10-30 NOTE — Progress Notes (Signed)
Received fax of tier exception form from ins- thought already approved so completed form and faxed back to them on 10/30/20.

## 2021-01-22 IMAGING — MR MR HEAD WO/W CM
12 of 17 series · 30 of 48 positions shown · IV contrast (gadavist)
Comparison: CTA head and neck 01/29/2019

CLINICAL DATA: Right-sided weakness

EXAM:
MRI HEAD WITHOUT AND WITH CONTRAST
TECHNIQUE: Multiplanar, multiecho pulse sequences of the brain and surrounding
structures were obtained without and with intravenous contrast.
CONTRAST:  10 mL Gadavist

[Series 5: DWI · axial · 3.0mm · 0.88mm/px · z∈[-76,+64]mm · 5 of 96 slices shown (1 of 4)]
[im 1/96]
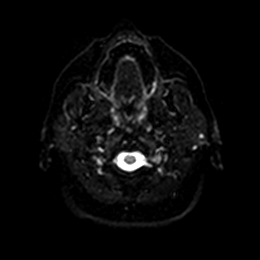
[im 24/96]
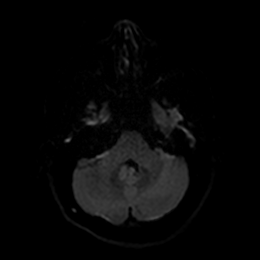
[im 48/96]
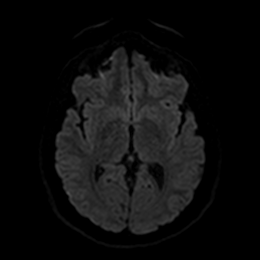
[im 72/96]
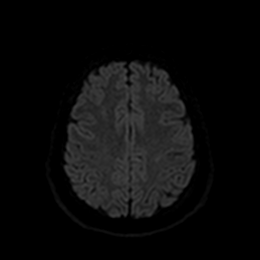
[im 96/96]
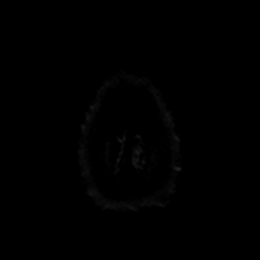

[Series 6: DWI · axial · 3.0mm · 0.88mm/px · z∈[-76,+64]mm · 3 of 48 slices shown (2 of 4)]
[im 1/48]
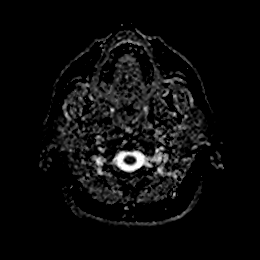
[im 24/48]
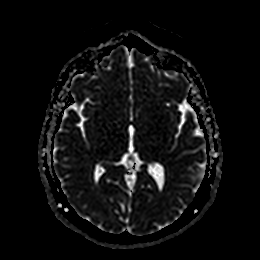
[im 48/48]
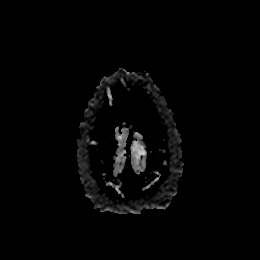

[Series 7: DWI · coronal · 4.0mm · 0.88mm/px · 4 of 64 slices shown (3 of 4)]
[im 1/64]
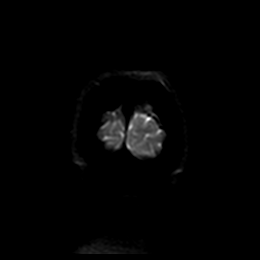
[im 22/64]
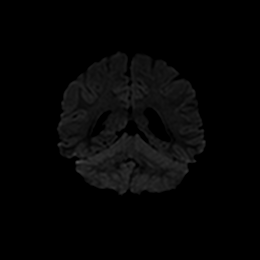
[im 43/64]
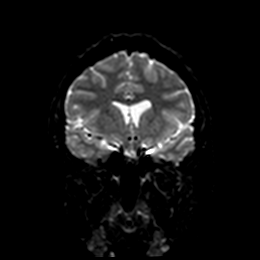
[im 64/64]
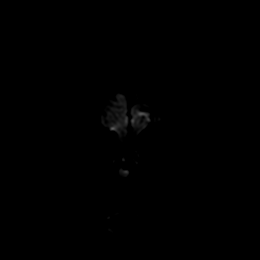

[Series 8: DWI · coronal · 4.0mm · 0.88mm/px · 1 of 32 slices shown (4 of 4)]
[im 1/32]
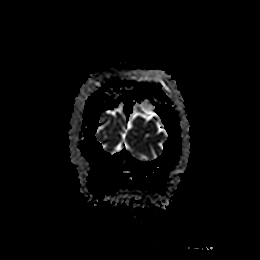

[Series 9: T1 · sagittal · 5.0mm · 0.75mm/px · 1 of 23 slices shown]
[im 1/23]
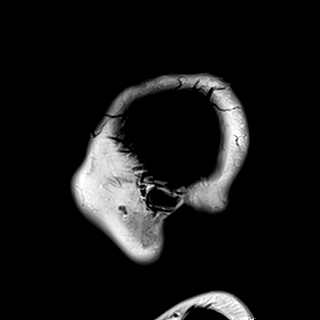

[Series 10: T2 · axial · 5.0mm · 0.72mm/px · 1 of 25 slices shown]
[im 1/25]
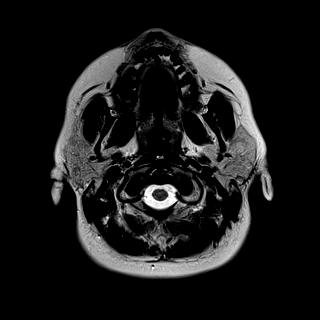

[Series 11: FLAIR · axial · 5.0mm · 0.45mm/px · 1 of 25 slices shown]
[im 1/25]
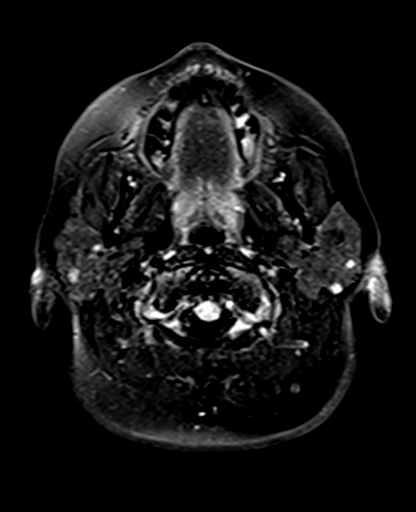

[Series 12: t2_space_dark-fluid_sag_p2_ns-ir · sagittal · 1.0mm · 0.49mm/px · 8 of 192 slices shown]
[im 1/192]
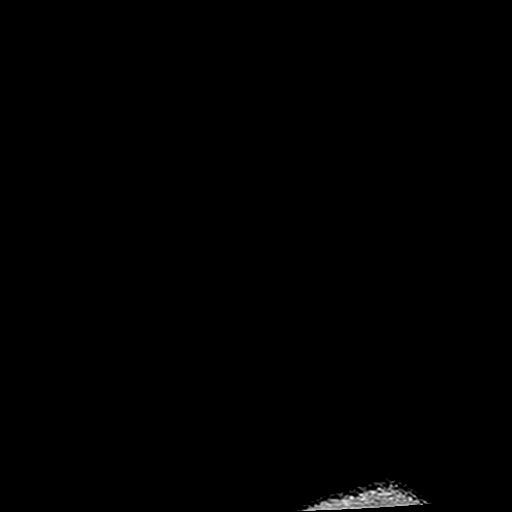
[im 28/192]
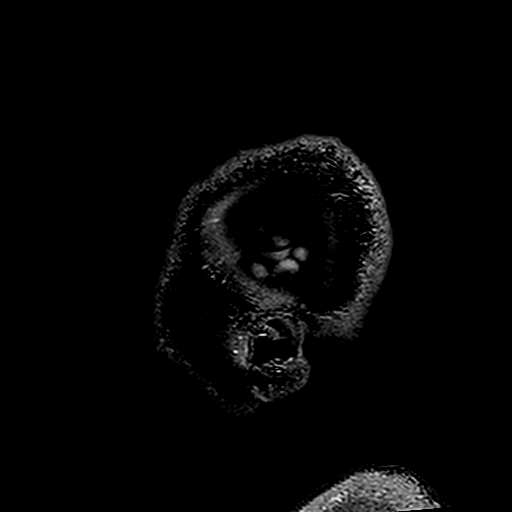
[im 55/192]
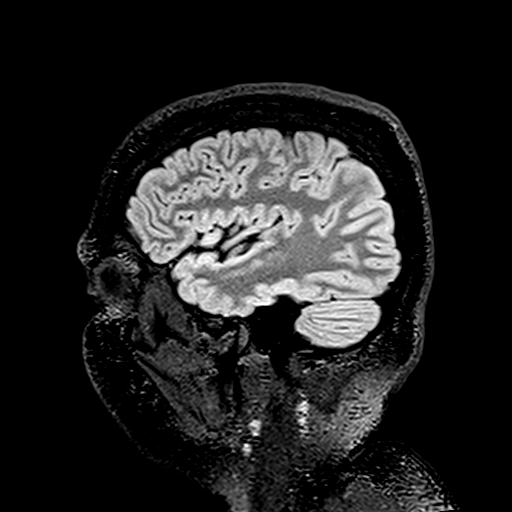
[im 82/192]
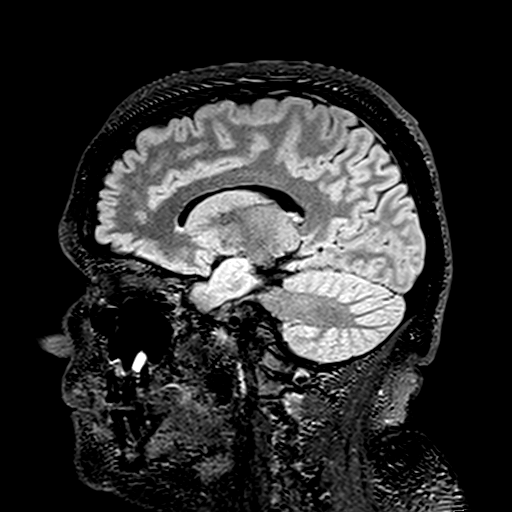
[im 110/192]
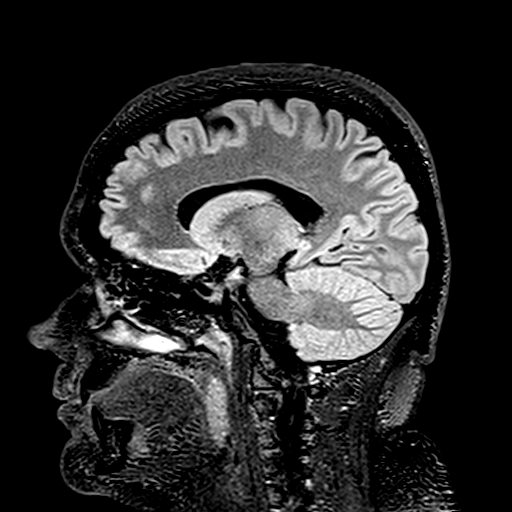
[im 137/192]
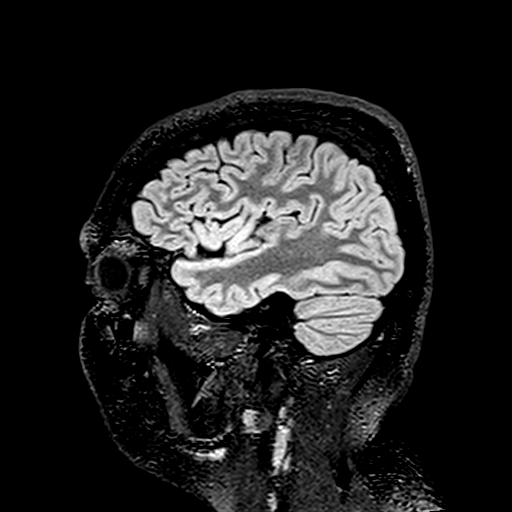
[im 164/192]
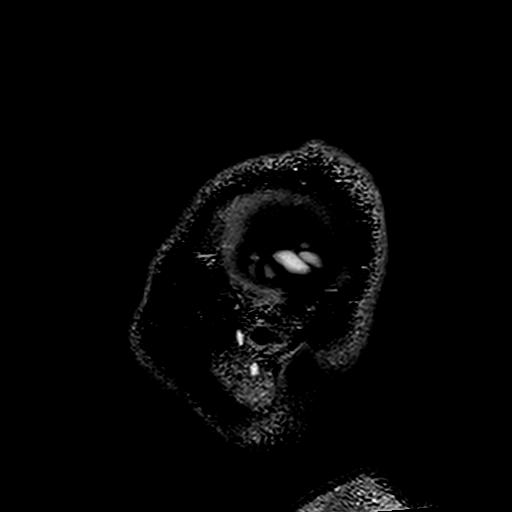
[im 192/192]
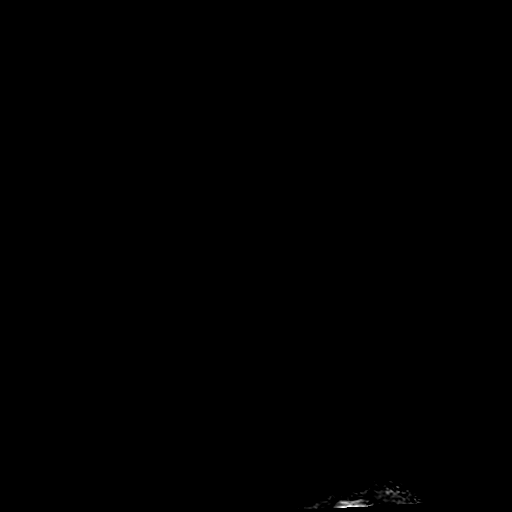

[Series 13: t2_space_dark-fluid_sag_p2_ns-ir_mpr_ axial · axial · 1.0mm · 0.45mm/px · z∈[-73,-16]mm · 3 of 145 slices shown]
[im 1/145]
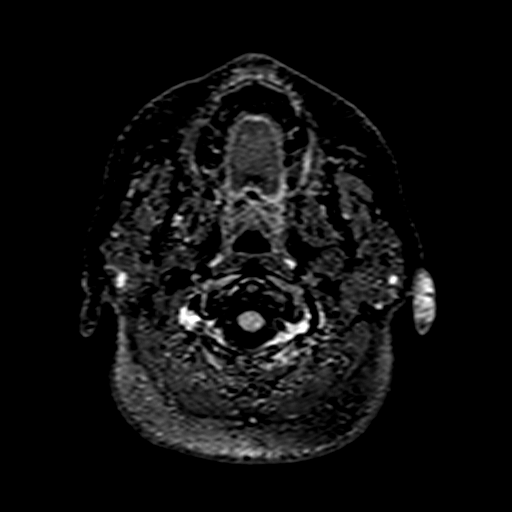
[im 29/145]
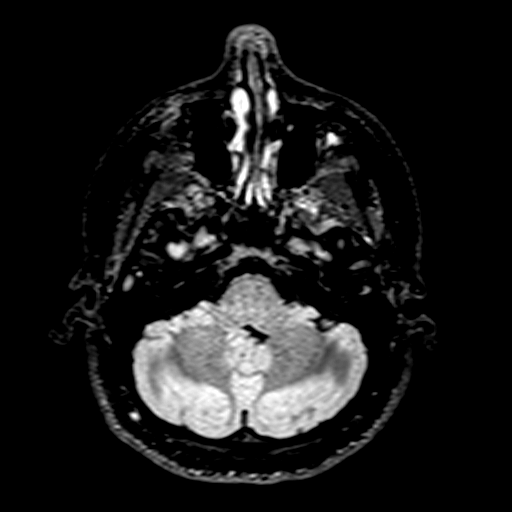
[im 58/145]
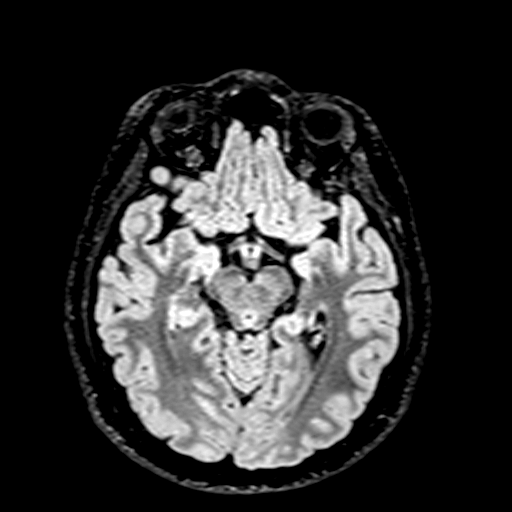

[Series 20: T2 post-contrast · coronal · 5.0mm · 0.72mm/px · 1 of 28 slices shown]
[im 1/28]
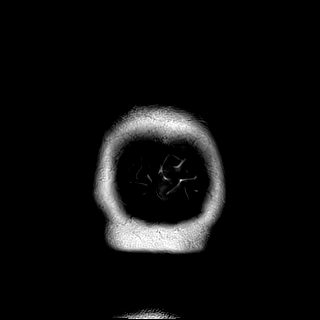

[Series 22: T1 post-contrast · coronal · 5.0mm · 0.34mm/px · 1 of 28 slices shown (1 of 2)]
[im 1/28]
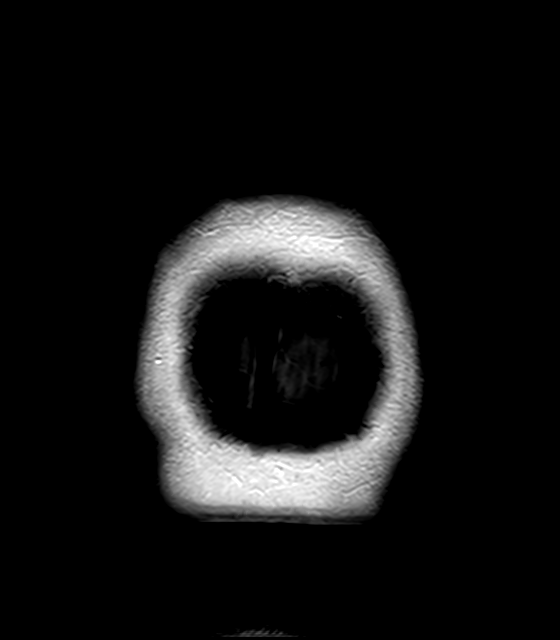

[Series 23: T1 post-contrast · sagittal · 5.0mm · 0.72mm/px · 1 of 23 slices shown (2 of 2)]
[im 1/23]
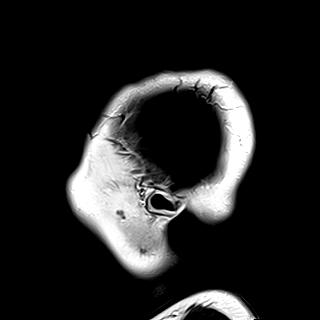

[30 of 48 positions shown; findings below may reference images not displayed]

FINDINGS: BRAIN: There is no acute infarct, acute hemorrhage, hydrocephalus or
extra-axial collection. The midline structures are normal. No
midline shift or other mass effect. Single focus of hyperintense
T2-weighted signal within the left parietal white matter. The
cerebral and cerebellar volume are age-appropriate.
Susceptibility-sensitive sequences show no chronic microhemorrhage
or superficial siderosis.

VASCULAR: Major intracranial arterial and venous sinus flow voids
are normal.

SKULL AND UPPER CERVICAL SPINE: Calvarial bone marrow signal is
normal. There is no skull base mass. Visualized upper cervical spine
and soft tissues are normal.

SINUSES/ORBITS: No fluid levels or advanced mucosal thickening. No
mastoid or middle ear effusion. The orbits are normal.
IMPRESSION: Single focus of hyperintense T2-weighted signal within the left
parietal white matter, non-specific and commonly seen in normal
patients. No acute abnormality or evidence for demyelinating
disease.

## 2021-02-12 ENCOUNTER — Telehealth: Payer: Self-pay | Admitting: Neurology

## 2021-02-12 NOTE — Telephone Encounter (Signed)
Patient called in stating she had COVID in January and she had a breakthrough migraine where she thought she might have had a seizure. The ER told her it was just a migraine mixed with Covid. She says she is having another one now. Her voice is shaky, she has pressure in her chest, and her blood pressure is high. She was not sure if this is normal or if she needs to try and go back to the ER?

## 2021-02-12 NOTE — Telephone Encounter (Signed)
Telephone call pt, Pt states she feels much better now.  The pain in her chest has easy up now. Her migraines have become more painful. Pt states she had moved from [place] so she been doing Virtual visit with Dr.Jaffe.  In January pt was seen in the ED, CT done Negative.   Pt states bp 150/89,  shortness of breathe, Tightness in the chest. This event  lasted about thirty minutes.  The tightness has gone down pt haven't taken her bp since.  Pt reports having a Migraine the yesterday.   Pt states it scared her a lot. She went to lay down.   Ask pt to take her BP now while we are on the phone. BP:152/96 P: 58   Advised pt that she needs to go the ED per DR.Jaffe.

## 2021-04-16 NOTE — Progress Notes (Signed)
Virtual Visit via Video Note The purpose of this virtual visit is to provide medical care while limiting exposure to the novel coronavirus.    Consent was obtained for video visit:  Yes.   Answered questions that patient had about telehealth interaction:  Yes.   I discussed the limitations, risks, security and privacy concerns of performing an evaluation and management service by telemedicine. I also discussed with the patient that there may be a patient responsible charge related to this service. The patient expressed understanding and agreed to proceed.  Pt location: Home Physician Location: office Name of referring provider:  Olive Bass,* I connected with Angelica Coleman at patients initiation/request on 04/19/2021 at  8:30 AM EDT by video enabled telemedicine application and verified that I am speaking with the correct person using two identifiers. Pt MRN:  [id number] Pt DOB:  28-Aug-1991 Video Participants:  Angelica Coleman  Assessment and Plan:   Migraine with aura, without status migrainosus, not intractable.  Migraines are now well-controlled.  As I am not licensed to practice in [place], this will be our last appointment.  She will continue Nurtec QOD and Ubrelvy 100mg  PRN.  As long as migraines are controlled, these medications may be managed by her PCP.  If they worsen, then referral to another local neurologist would be indicated.  History of Present Illness:  Angelica Coleman is a 30year old left-handed womanwithmitral regurgitation, palpitations and history of recurrent kidney stones who follows up for migraines.  UPDATE: Headaches increased back in January after getting COVID-19.  That has since calmed down.    Intensity:Moderate to severe Duration: Usually within an houroff and on. Frequency:1day in a month.  She has not been able to establish care yet with a neurologist because the only neurologist in the area retired.    Rescue protocol:Naproxen  550mg  Current NSAID: Naproxen 550mg  Current analgesic:None Current anti-emetic:none Current triptan:None Current antihypertensive: None Current antidepressant: sertraline 25mg  QD Current anti-CGRP: Nurtec 75mg  QOD, Ubrelvy  Caffeine: No Exercise: Trying to exercise Depression: No; Anxiety: Some Sleep hygiene: Good.  HISTORY: In 2016, while pregnant with her son, she developed numbness and tingling of her hands and nose associated with vision loss in both eyes described as only seeing the outline of whatever she looks at with blurred vision. Numbness lasts 20 minutes but visual disturbance lasts longer. It is followed by severe pounding headache on crown lasting into the next day. It is associated with nausea, photophobia but no unilateral weakness. It is aggravated by looking at computer screens or fluorescent lights. It is relieved by resting in dark room.  In September 2019, she got home from work and had another episode. She went to bed and when she woke up, she was fine. Later the next day, symptoms returned. She had associated fatigue. Symptoms lasted about 20 minutes and followed by the headache. She went to Urgent Care and was referred to neurology.  In January 2020,she woke up with excruciating headache. Sumatriptan effective after 2nd dose. She developed numbness in the right hand. The next day, she woke up and her right hand and forearm was still numb. She went to work and she had trouble using the keyboard on her computer due to difficulty with her fingers in the right hand. A couple of days later, she developed sudden right sided numbness face, arm, torso and leg, which lasted about 24 hours. She was having trouble with memory such as names and events. She had an MRI of brain with and  without contrast performed on 01/08/19, which demonstrated 3 mm enhancing nodular focus in the superficial left parietal region with small amount of adjacent  parenchymal T2/FLAIR hyperintensity. Differential mentioned includes "questionable small subacute infarct with parenchymal enhancement versus possible mycotic aneurysm with adjacent inflammatory changes versus other." Imaging not available to personally review. She was started on ASA 325mg  daily by her PCP for possible stroke. She was feeling fatigued for the next couple of weeks. No fevers. She started feeling better last week. Every now and then she notes random bursts of scalp neuralgia (burning/needles) for 5 minutes. Due to findings on brain MRI from January, she underwent workup.CTA of head and neck from 01/29/19 personally reviewed showed mild cervical ICA tortuosity at the C2 level but otherwise no aneurysm or vascular occlusion or stenosis. She presented to the ED on 02/11/19 for right sided numbness and tingling and lightheadedness. MRI of brain with and without contrast was personally reviewed and demonstrated a single nonspecific hyperintense T2 focus in the left parietal white matter. She was treated with a headache cocktail. Repeat MRI of brain with and without contrast from 05/22/19 was personally reviewed and was unchanged.She continued to have headaches often with nausea, dizziness and difficulty focusing.  She took ibuprofen 200mg  which was ineffective. 600mg  helped to at least go to sleep. She did not tolerate Excedrin.  Shepreviously tookToprol XL 25mg  for palpitations. Palpitations have been stronger lately and usually occur during her menstrual cycle.  Family history: Mother had stroke in her 71s. Shealso oncehad a migraine with stroke-like symptoms but no prior history of migraines. Her uncle also had a migraine with stroke-like symptoms as well.  Past NSAIDS: ibuprofen Past anti-emetic: Zofran ODT 8mg  Past antihypertensive: Toprol XL (palpitations), propranolol ER 60mg  (palpitations) Past antidepressant: Nortriptyline (nightmares), venlafaxine  (increased headaches) Past anti-CGRP: Aimovig 140mg  (effective, hives), Emgality (injection site reaction), Ubrelvy Contraindicated medications: Triptans (stroke-like symptoms with migraine); topiramate (history of recurrent kidney stones) Past anti-emetic: Zofran ODT 8mg   Past Medical History: Past Medical History:  Diagnosis Date  . Chronic bronchitis (HCC)    as a child  . Kidney stones   . Migraines     Medications: Outpatient Encounter Medications as of 04/19/2021  Medication Sig  . ketoconazole (NIZORAL) 2 % shampoo Apply 1 application topically 2 (two) times a week. (Patient not taking: Reported on 10/20/2020)  . naproxen sodium (ALEVE) 220 MG tablet Take 220 mg by mouth 2 (two) times daily as needed (headache/pain). (Patient not taking: Reported on 10/20/2020)  . naproxen sodium (ANAPROX) 550 MG tablet Take 1 tablet (550 mg total) by mouth 2 (two) times daily as needed for headache.  . ondansetron (ZOFRAN ODT) 8 MG disintegrating tablet Take 1 tablet (8 mg total) by mouth every 8 (eight) hours as needed for nausea or vomiting.  . Rimegepant Sulfate (NURTEC) 75 MG TBDP Take 75 mg by mouth every other day.  56s Ubrogepant (UBRELVY) 100 MG TABS Take 100 mg by mouth every 2 (two) hours as needed. Max 200 mg 24 hrs (Patient not taking: Reported on 10/20/2020)   No facility-administered encounter medications on file as of 04/19/2021.    Allergies: Allergies  Allergen Reactions  . Percocet [Oxycodone-Acetaminophen] Other (See Comments)    Pt reports fainting  . Penicillins Hives    Has patient had a PCN reaction causing immediate rash, facial/tongue/throat swelling, SOB or lightheadedness with hypotension: Yes Has patient had a PCN reaction causing severe rash involving mucus membranes or skin necrosis: No Has patient had a  PCN reaction that required hospitalization: No Has patient had a PCN reaction occurring within the last 10 years: Yes If all of the above answers are "NO",  then may proceed with Cephalosporin use.   Dennie Fetters [Erenumab-Aooe]     Hives with the 140mg  dose     Family History: Family History  Problem Relation Age of Onset  . Hypertension Mother   . High Cholesterol Mother   . CVA Mother 58  . Hypertension Father   . High Cholesterol Father   . Diabetes Sister   . Hypertension Sister     Observations/Objective:   Height 5\' 2"  (1.575 m), weight 200 lb (90.7 kg). No acute distress.  Alert and oriented.  Speech fluent and not dysarthric.  Language intact.     Follow Up Instructions:    -I discussed the assessment and treatment plan with the patient. The patient was provided an opportunity to ask questions and all were answered. The patient agreed with the plan and demonstrated an understanding of the instructions.   The patient was advised to call back or seek an in-person evaluation if the symptoms worsen or if the condition fails to improve as anticipated.    56, DO

## 2021-04-19 ENCOUNTER — Telehealth (INDEPENDENT_AMBULATORY_CARE_PROVIDER_SITE_OTHER): Payer: 59 | Admitting: Neurology

## 2021-04-19 ENCOUNTER — Other Ambulatory Visit: Payer: Self-pay

## 2021-04-19 VITALS — Ht 62.0 in | Wt 200.0 lb

## 2021-04-19 DIAGNOSIS — G43109 Migraine with aura, not intractable, without status migrainosus: Secondary | ICD-10-CM

## 2021-07-14 NOTE — Progress Notes (Signed)
99Your PA has been faxed to the plan as a paper copy. Please contact the plan directly if you haven't received a determination in a typical timeframe.    Key [id number] sent to cover my meds, allow 5 days for status

## 2021-07-15 NOTE — Progress Notes (Signed)
No pre authorization needed  

## 2021-10-22 IMAGING — US US ABDOMEN COMPLETE
1 series · 14 of 25 positions shown · non-contrast
Comparison: CT abdomen and pelvis 10/31/2018

CLINICAL DATA: Right upper quadrant pain for 3 weeks.

EXAM:
ABDOMEN ULTRASOUND COMPLETE

[Series 1: us abdomen complete · 0.20mm/px · 14 of 78 slices shown]
[im 1/78]
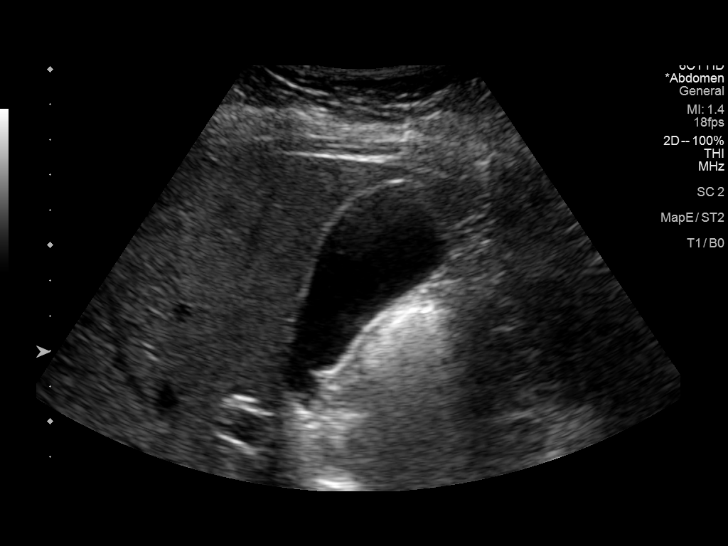
[im 7/78]
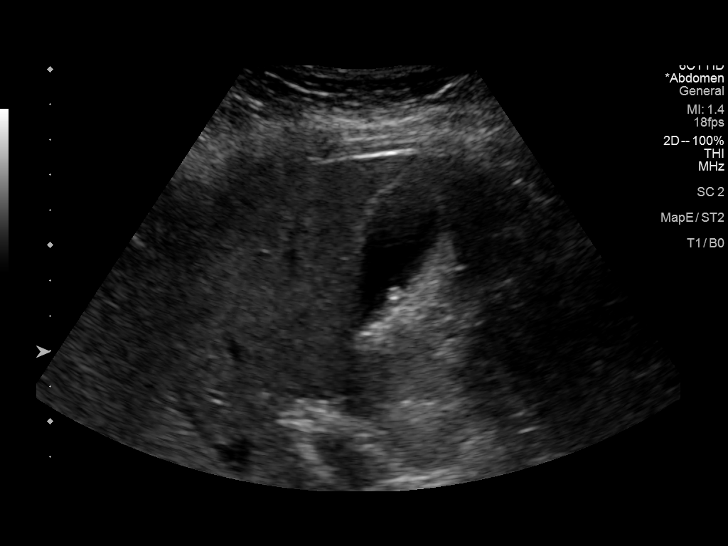
[im 13/78]
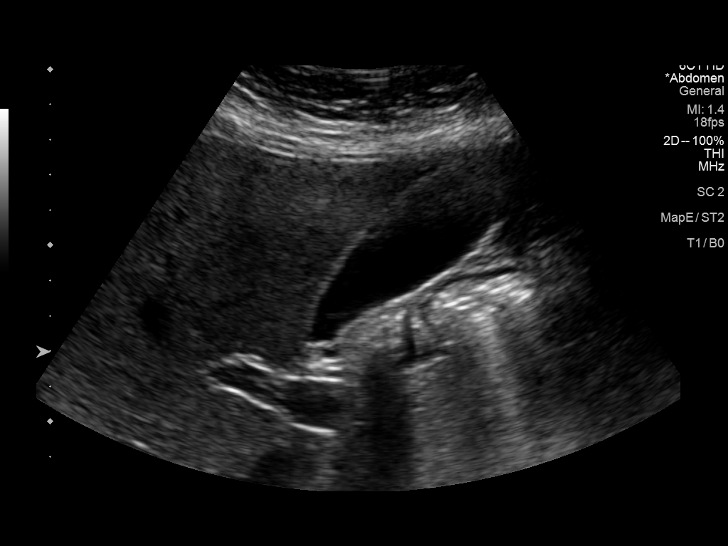
[im 20/78]
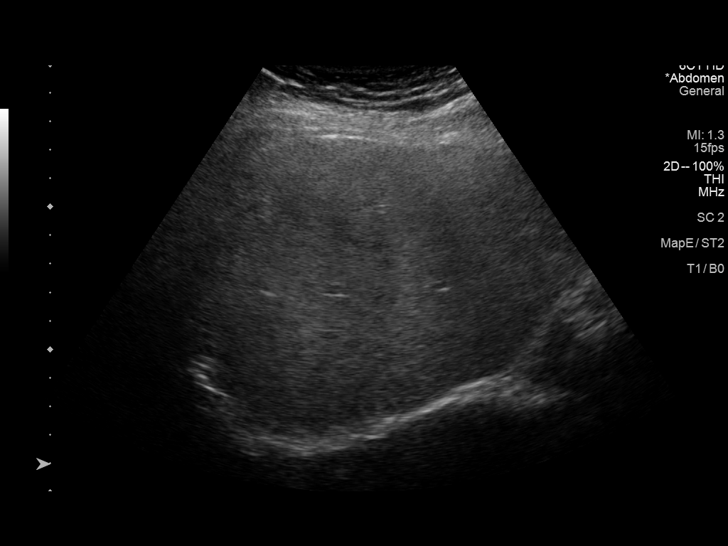
[im 26/78]
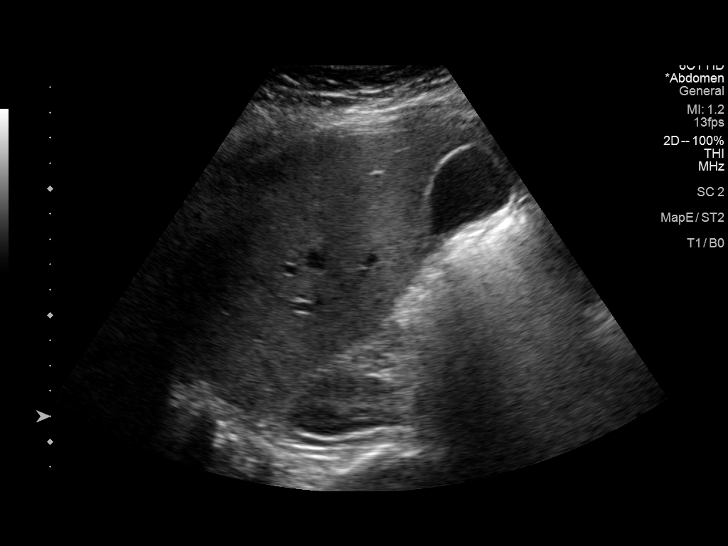
[im 29/78]
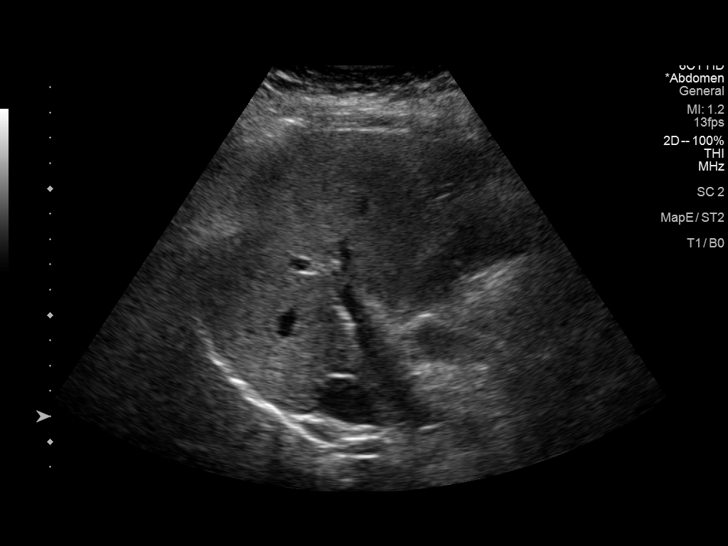
[im 36/78]
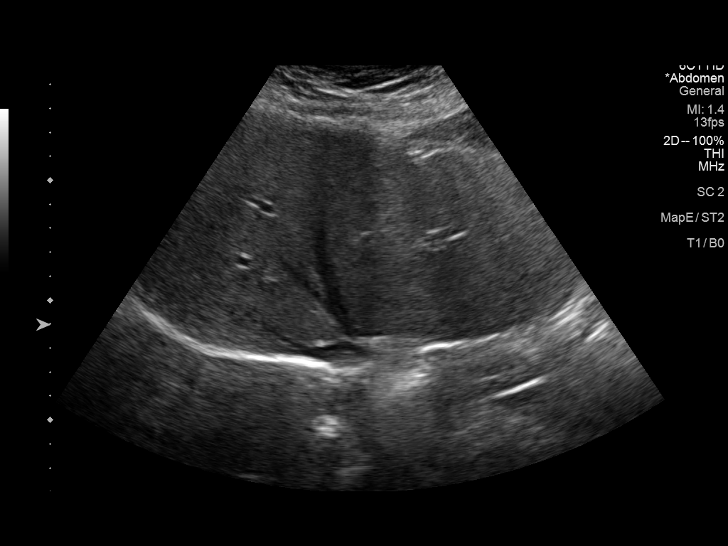
[im 42/78]
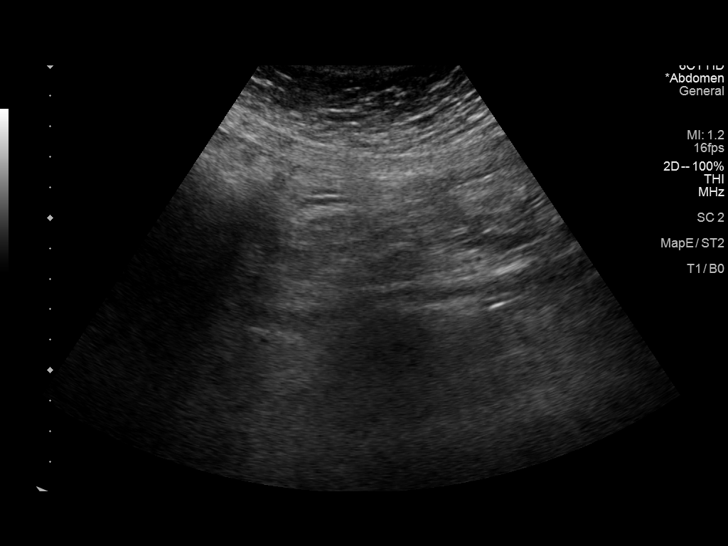
[im 49/78]
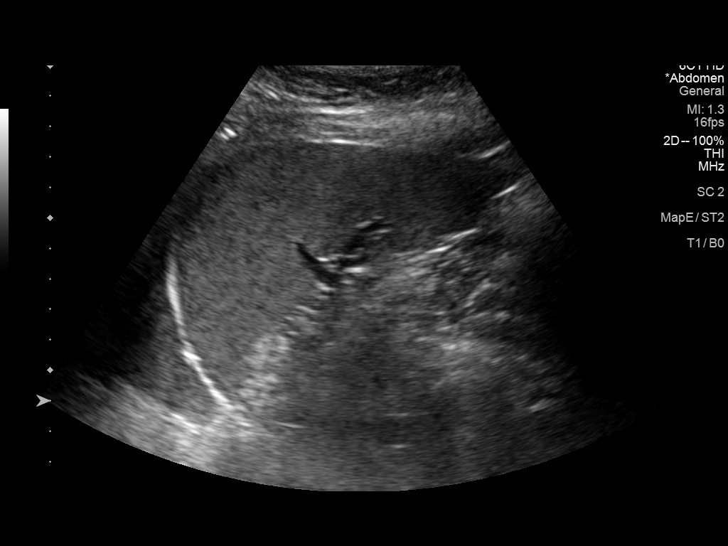
[im 52/78]
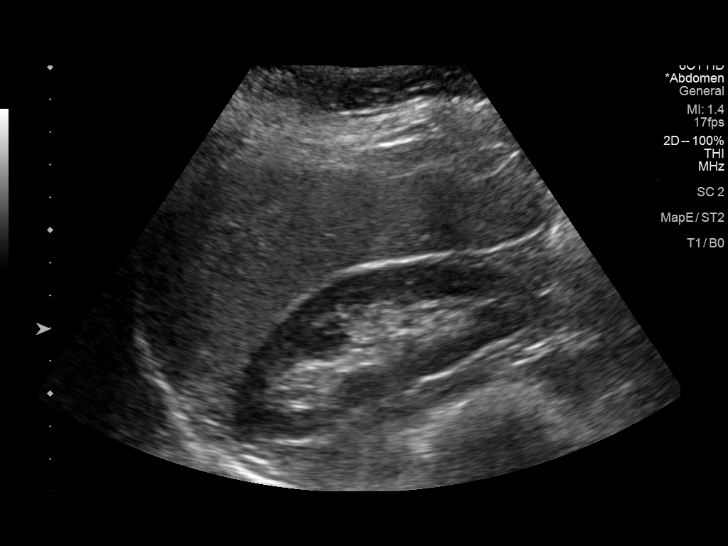
[im 58/78]
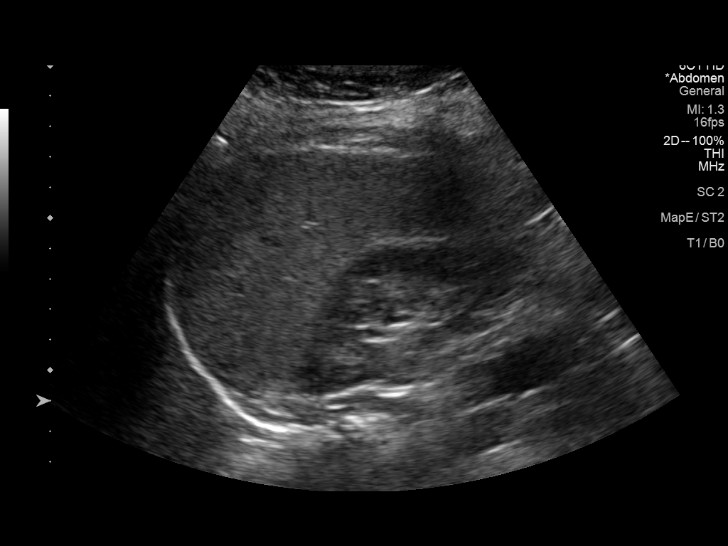
[im 65/78]
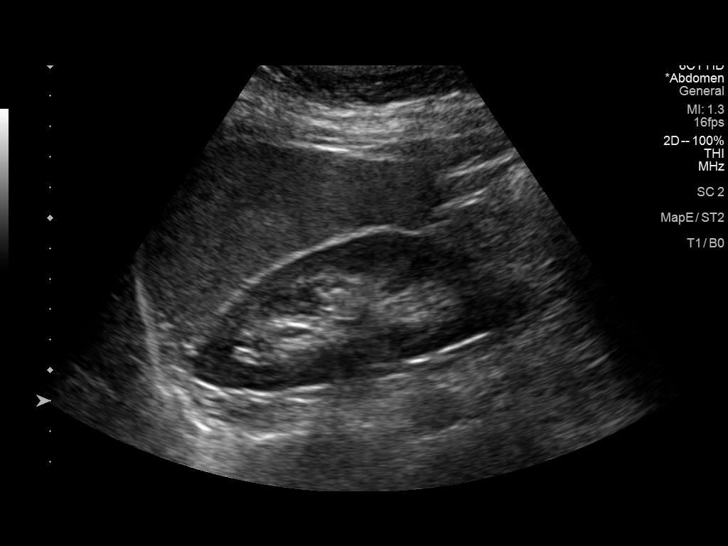
[im 71/78]
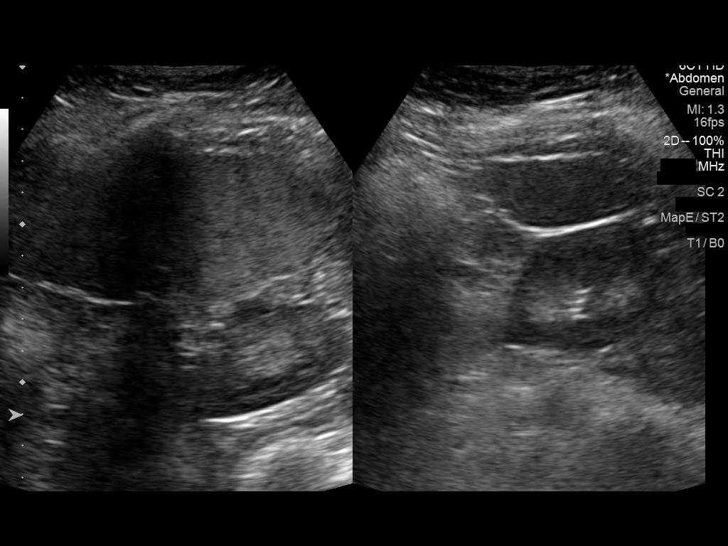
[im 78/78]
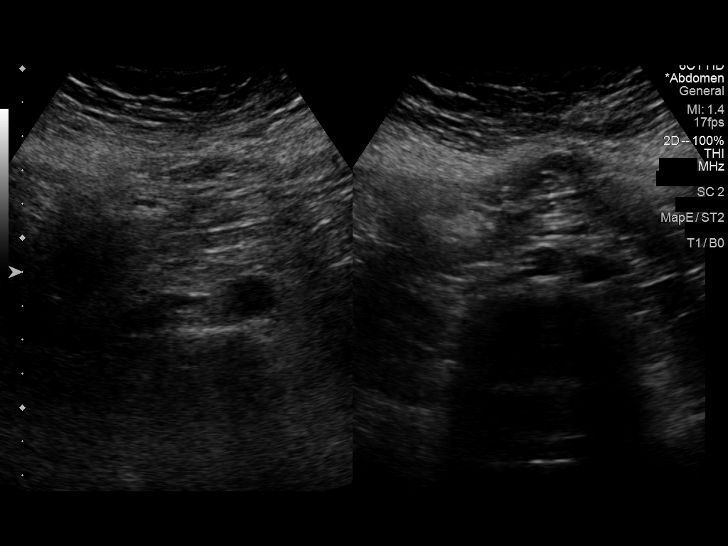

[14 of 25 positions shown; findings below may reference images not displayed]

FINDINGS: Gallbladder: 5 mm nonshadowing, nonmobile echogenic focus adherent
to the gallbladder wall compatible with a polyp. No gallstones or
wall thickening visualized. No sonographic Murphy sign noted by
sonographer.

Common bile duct: Diameter: 3 mm

Liver: Normal to borderline increased parenchymal echogenicity
without a focal lesion identified. Portal vein is patent on color
Doppler imaging with normal direction of blood flow towards the
liver.

IVC: No abnormality visualized.

Pancreas: Visualized portion unremarkable.

Spleen: Size and appearance within normal limits.

Right Kidney: Length: 10.1 cm. Echogenicity within normal limits. No
mass or hydronephrosis visualized.

Left Kidney: Length: 10.6 cm. Echogenicity within normal limits. No
mass or hydronephrosis visualized.

Abdominal aorta: No aneurysm visualized.

Other findings: None.
IMPRESSION: 1. 5 mm gallbladder polyp.
2. No gallstones or evidence of cholecystitis or other acute
abnormality.

## 2022-04-10 IMAGING — CR DG CHEST 2V
2 series · 2 of 2 positions shown · non-contrast
Comparison: 07/11/2018

CLINICAL DATA: Chest pain.

EXAM:
CHEST - 2 VIEW

[w chest pa]
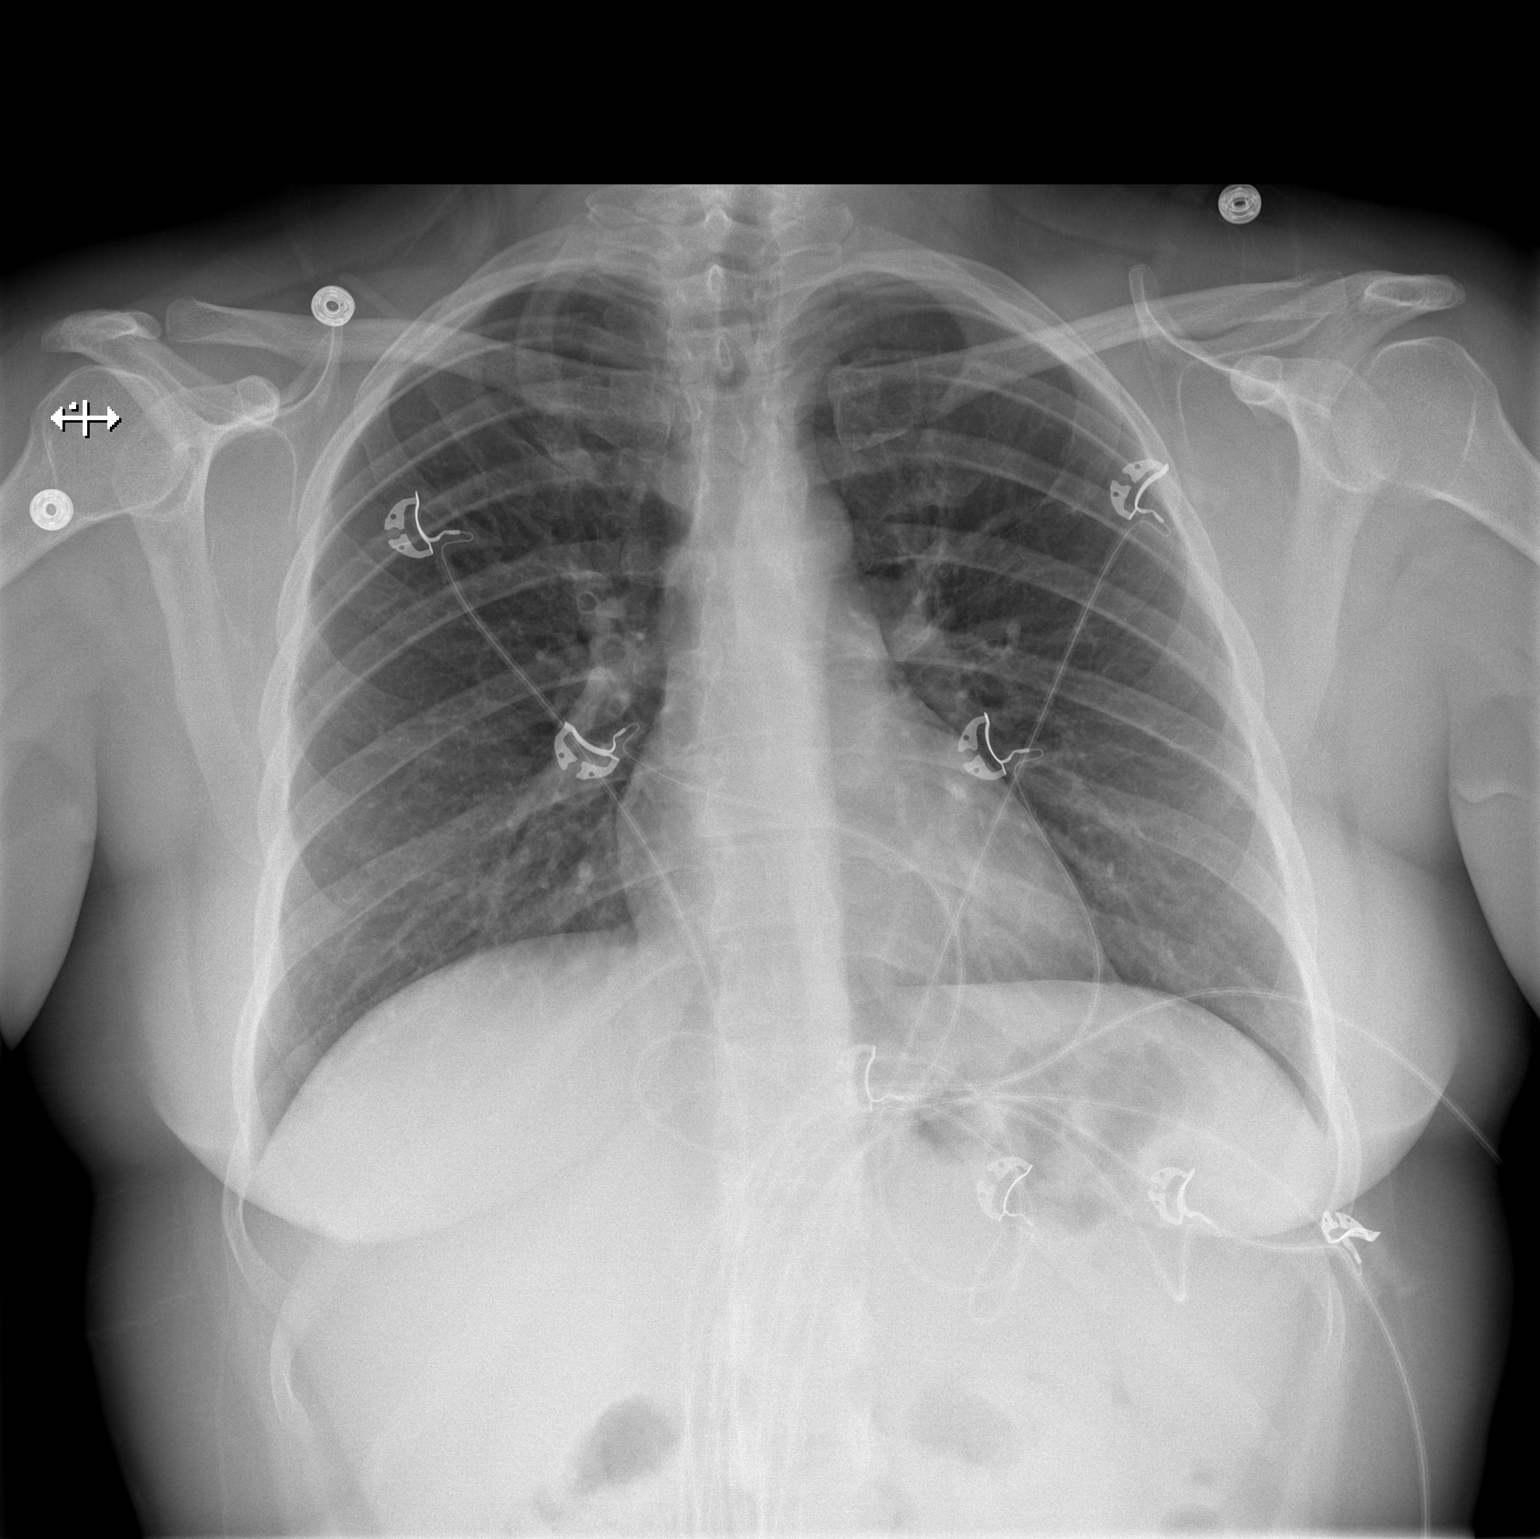

[w chest lat]
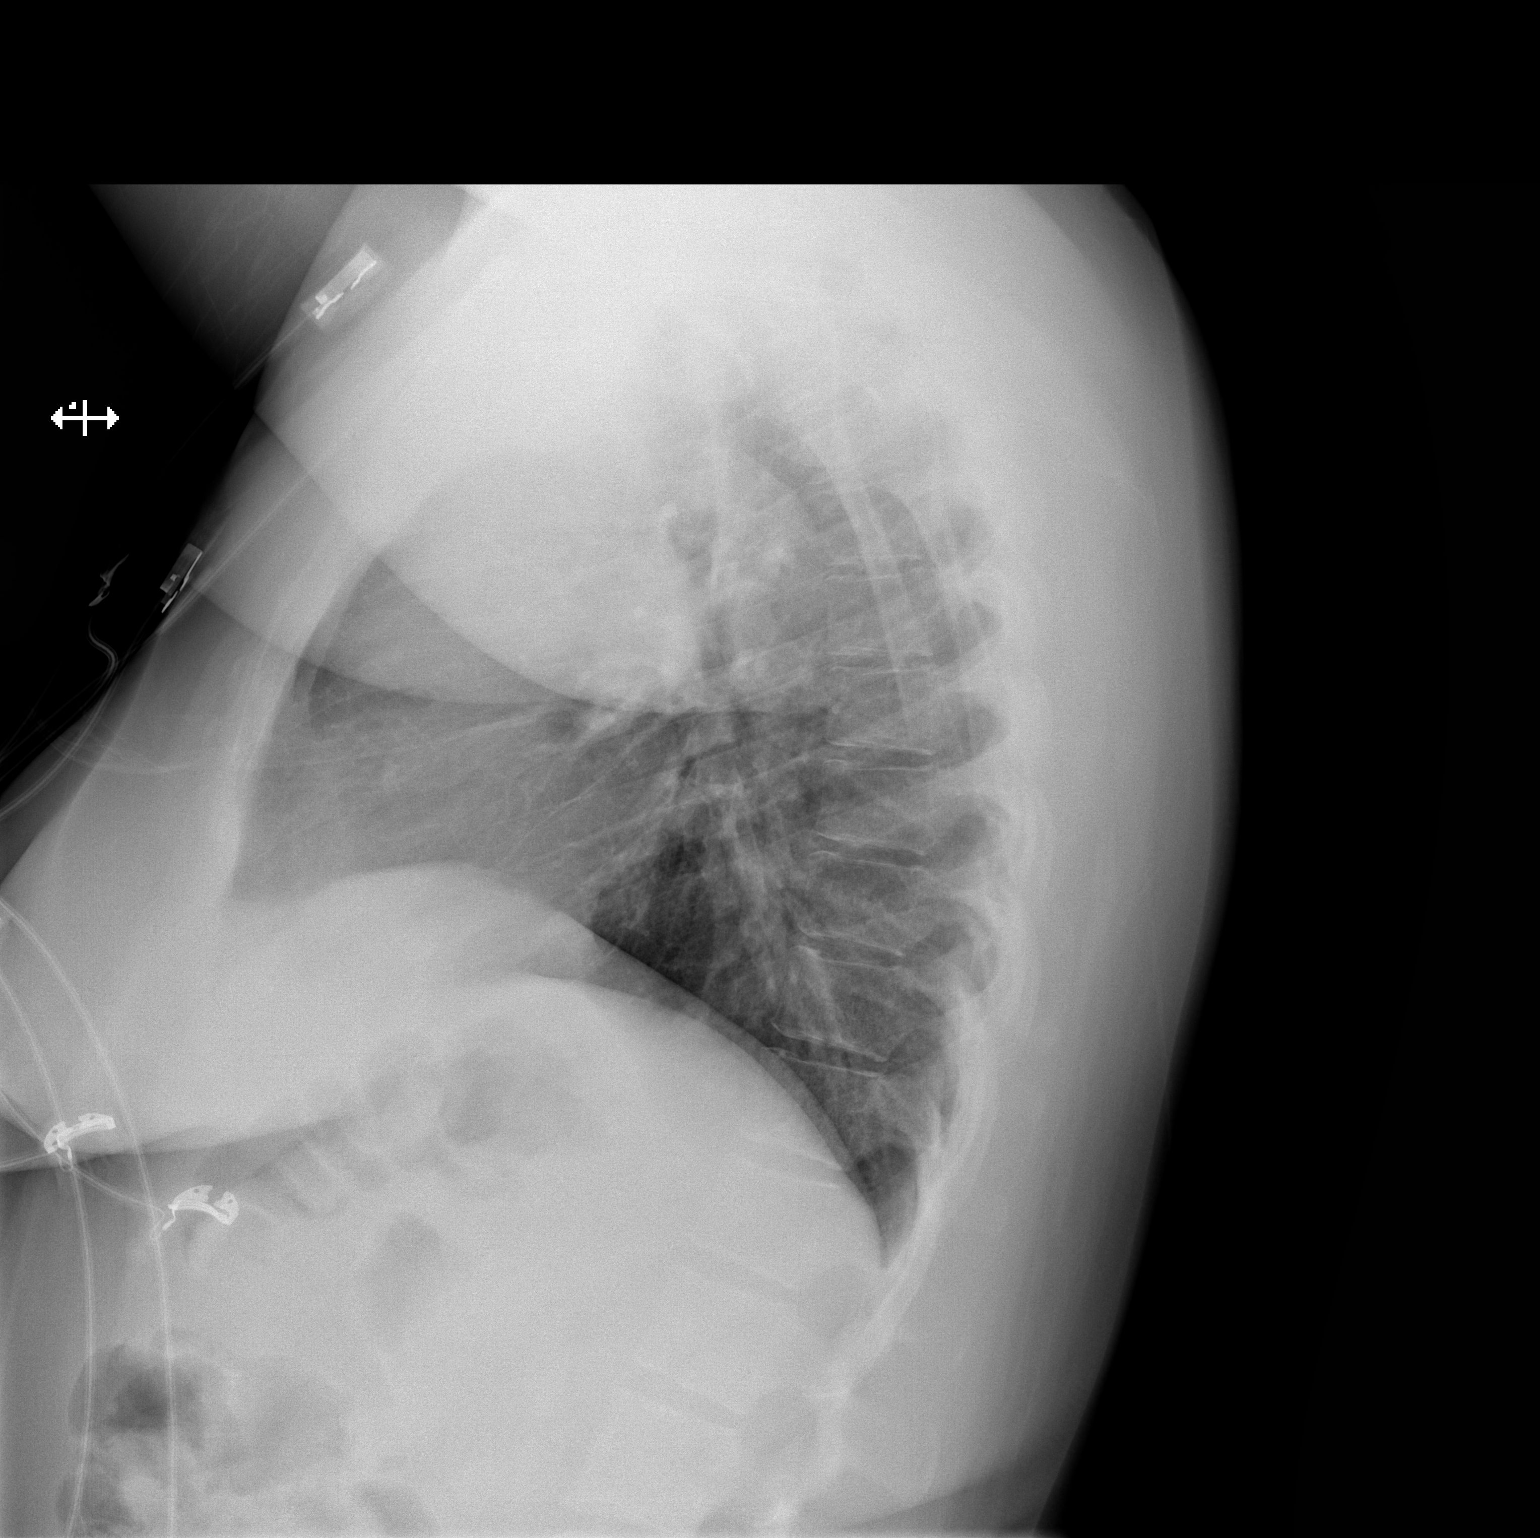

[2 of 2 positions shown; findings below may reference images not displayed]

FINDINGS: The heart size and mediastinal contours are within normal limits.
Both lungs are clear. The visualized skeletal structures are
unremarkable.
IMPRESSION: No active cardiopulmonary disease.
